# Patient Record
Sex: Female | Born: 1975
Health system: Southern US, Community
[De-identification: ages and names within clinical notes are randomized; demographics above are authoritative.]

## PROBLEM LIST (undated history)

## (undated) DIAGNOSIS — R519 Headache, unspecified: Secondary | ICD-10-CM

## (undated) DIAGNOSIS — E039 Hypothyroidism, unspecified: Secondary | ICD-10-CM

## (undated) DIAGNOSIS — O1490 Unspecified pre-eclampsia, unspecified trimester: Secondary | ICD-10-CM

## (undated) DIAGNOSIS — C801 Malignant (primary) neoplasm, unspecified: Secondary | ICD-10-CM

## (undated) DIAGNOSIS — F32A Depression, unspecified: Secondary | ICD-10-CM

## (undated) DIAGNOSIS — O1420 HELLP syndrome (HELLP), unspecified trimester: Secondary | ICD-10-CM

## (undated) DIAGNOSIS — R51 Headache: Secondary | ICD-10-CM

## (undated) DIAGNOSIS — F419 Anxiety disorder, unspecified: Secondary | ICD-10-CM

## (undated) HISTORY — DX: Anxiety disorder, unspecified: F41.9

## (undated) HISTORY — PX: COSMETIC SURGERY: SHX468

## (undated) HISTORY — DX: Malignant (primary) neoplasm, unspecified: C80.1

## (undated) HISTORY — PX: BREAST SURGERY: SHX581

## (undated) HISTORY — DX: Depression, unspecified: F32.A

---

## 2003-04-17 HISTORY — PX: TONSILLECTOMY: SUR1361

## 2013-04-10 ENCOUNTER — Emergency Department: Payer: Self-pay | Admitting: Emergency Medicine

## 2013-04-10 LAB — COMPREHENSIVE METABOLIC PANEL
Albumin: 4.5 g/dL (ref 3.4–5.0)
Anion Gap: 6 — ABNORMAL LOW (ref 7–16)
BUN: 11 mg/dL (ref 7–18)
Calcium, Total: 9.2 mg/dL (ref 8.5–10.1)
Chloride: 107 mmol/L (ref 98–107)
Co2: 24 mmol/L (ref 21–32)
EGFR (African American): >60
EGFR (Non-African Amer.): >60
Osmolality: 273 (ref 275–301)
Potassium: 3.8 mmol/L (ref 3.5–5.1)
SGPT (ALT): 21 U/L (ref 12–78)
Total Protein: 8 g/dL (ref 6.4–8.2)

## 2013-04-10 LAB — URINALYSIS, COMPLETE
Glucose,UR: NEGATIVE mg/dL (ref 0–75)
Nitrite: NEGATIVE
Ph: 6 (ref 4.5–8.0)
Protein: 30
RBC,UR: 311 /HPF (ref 0–5)
Specific Gravity: 1.006 (ref 1.003–1.030)
Squamous Epithelial: 1

## 2013-04-10 LAB — CBC
MCH: 31 pg (ref 26.0–34.0)
MCHC: 33.5 g/dL (ref 32.0–36.0)
MCV: 93 fL (ref 80–100)
RDW: 12 % (ref 11.5–14.5)

## 2013-04-10 LAB — WET PREP, GENITAL

## 2013-04-10 LAB — GC/CHLAMYDIA PROBE AMP

## 2013-07-28 ENCOUNTER — Other Ambulatory Visit: Payer: Self-pay

## 2013-09-21 ENCOUNTER — Encounter: Payer: Self-pay | Admitting: Maternal and Fetal Medicine

## 2013-10-22 ENCOUNTER — Encounter: Payer: Self-pay | Admitting: Obstetrics and Gynecology

## 2013-11-24 ENCOUNTER — Encounter: Payer: Self-pay | Admitting: Pediatric Cardiology

## 2013-11-24 DIAGNOSIS — Z8279 Family history of other congenital malformations, deformations and chromosomal abnormalities: Secondary | ICD-10-CM | POA: Insufficient documentation

## 2014-01-03 ENCOUNTER — Observation Stay: Payer: Self-pay

## 2014-01-03 LAB — URINALYSIS, COMPLETE
BILIRUBIN, UR: NEGATIVE
Glucose,UR: NEGATIVE mg/dL (ref 0–75)
Ketone: NEGATIVE
LEUKOCYTE ESTERASE: NEGATIVE
NITRITE: NEGATIVE
PH: 6 (ref 4.5–8.0)
PROTEIN: NEGATIVE
RBC,UR: 73 /HPF (ref 0–5)
Specific Gravity: 1.005 (ref 1.003–1.030)
WBC UR: 18 /HPF (ref 0–5)

## 2014-01-05 LAB — URINE CULTURE

## 2014-01-25 ENCOUNTER — Observation Stay: Payer: Self-pay | Admitting: Obstetrics and Gynecology

## 2014-01-27 ENCOUNTER — Observation Stay: Payer: Self-pay | Admitting: Obstetrics and Gynecology

## 2014-01-27 ENCOUNTER — Ambulatory Visit: Payer: Self-pay | Admitting: Obstetrics and Gynecology

## 2014-01-27 LAB — CBC WITH DIFFERENTIAL/PLATELET
BASOS PCT: 0.3 %
Basophil #: 0 10*3/uL (ref 0.0–0.1)
EOS PCT: 0.5 %
Eosinophil #: 0.1 10*3/uL (ref 0.0–0.7)
HCT: 38.1 % (ref 35.0–47.0)
HGB: 12.6 g/dL (ref 12.0–16.0)
LYMPHS ABS: 1.8 10*3/uL (ref 1.0–3.6)
LYMPHS PCT: 12.9 %
MCH: 31.1 pg (ref 26.0–34.0)
MCHC: 33.1 g/dL (ref 32.0–36.0)
MCV: 94 fL (ref 80–100)
Monocyte #: 1.1 x10 3/mm — ABNORMAL HIGH (ref 0.2–0.9)
Monocyte %: 7.7 %
Neutrophil #: 10.9 10*3/uL — ABNORMAL HIGH (ref 1.4–6.5)
Neutrophil %: 78.6 %
Platelet: 112 10*3/uL — ABNORMAL LOW (ref 150–440)
RBC: 4.06 10*6/uL (ref 3.80–5.20)
RDW: 13.7 % (ref 11.5–14.5)
WBC: 13.9 10*3/uL — AB (ref 3.6–11.0)

## 2014-01-27 LAB — COMPREHENSIVE METABOLIC PANEL
ALT: 75 U/L — AB
AST: 83 U/L — AB (ref 15–37)
Albumin: 2.7 g/dL — ABNORMAL LOW (ref 3.4–5.0)
Alkaline Phosphatase: 113 U/L
Anion Gap: 10 (ref 7–16)
BILIRUBIN TOTAL: 0.5 mg/dL (ref 0.2–1.0)
BUN: 9 mg/dL (ref 7–18)
CHLORIDE: 106 mmol/L (ref 98–107)
Calcium, Total: 8.4 mg/dL — ABNORMAL LOW (ref 8.5–10.1)
Co2: 24 mmol/L (ref 21–32)
Creatinine: 0.74 mg/dL (ref 0.60–1.30)
EGFR (African American): >60
EGFR (Non-African Amer.): >60
Glucose: 84 mg/dL (ref 65–99)
Osmolality: 277 (ref 275–301)
POTASSIUM: 3.7 mmol/L (ref 3.5–5.1)
SODIUM: 140 mmol/L (ref 136–145)
Total Protein: 6.2 g/dL — ABNORMAL LOW (ref 6.4–8.2)

## 2014-01-27 LAB — AMYLASE: AMYLASE: 38 U/L (ref 25–115)

## 2014-01-27 LAB — LIPASE, BLOOD: Lipase: 91 U/L (ref 73–393)

## 2014-02-05 DIAGNOSIS — O1420 HELLP syndrome (HELLP), unspecified trimester: Secondary | ICD-10-CM

## 2014-02-05 DIAGNOSIS — O1490 Unspecified pre-eclampsia, unspecified trimester: Secondary | ICD-10-CM

## 2014-04-29 LAB — HM PAP SMEAR: HM PAP: NEGATIVE

## 2014-08-24 NOTE — H&P (Signed)
L&D Evaluation:  History:  Presents with abdominal pain   Patient's Medical History No Chronic Illness   Patient's Surgical History T&A   Medications Pre Natal Vitamins   Allergies NKDA   Social History none   Family History Non-Contributory   ROS:  ROS All systems were reviewed.  HEENT, CNS, GI, GU, Respiratory, CV, Renal and Musculoskeletal systems were found to be normal.   Exam:  Vital Signs stable   General writhing in bad, states she can't get comfortable   Mental Status clear  anxious   Chest clear   Heart normal sinus rhythm   Abdomen gravid, non-tender   Estimated Fetal Weight Average for gestational age   Back no CVAT   Edema no edema   Pelvic cervix closed and thick   FHT normal rate with no decels   Fetal Heart Rate 160   Ucx absent   Skin dry   Impression:  Impression pelvic pain at [redacted]w[redacted]d   Plan:  Plan UA, EFM/NST, fluids, tylenol PO   Electronic Signatures: Trudee Kuster, Quinnetta Roepke N (CNM)  (Signed 20-Sep-15 16:57)  Authored: L&D Evaluation   Last Updated: 20-Sep-15 16:57 by Evonnie Pat (CNM)

## 2014-11-01 ENCOUNTER — Telehealth: Payer: Self-pay | Admitting: Obstetrics and Gynecology

## 2014-11-01 NOTE — Telephone Encounter (Signed)
Pls advise.  

## 2014-11-01 NOTE — Telephone Encounter (Signed)
Pt let refill run out of xanax, she needs refill, or ativan.

## 2014-11-02 ENCOUNTER — Other Ambulatory Visit: Payer: Self-pay | Admitting: Obstetrics and Gynecology

## 2014-11-02 MED ORDER — ALPRAZOLAM 0.5 MG PO TABS
0.5000 mg | ORAL_TABLET | Freq: Every evening | ORAL | Status: DC | PRN
Start: 2014-11-02 — End: 2015-03-31

## 2014-11-02 NOTE — Telephone Encounter (Signed)
Please let her know I sent in rx for xanax

## 2014-11-02 NOTE — Telephone Encounter (Signed)
Left detailed message - rx sent to pharmacy  

## 2015-02-10 ENCOUNTER — Ambulatory Visit: Payer: Self-pay | Admitting: Obstetrics and Gynecology

## 2015-02-16 ENCOUNTER — Encounter: Payer: Self-pay | Admitting: *Deleted

## 2015-02-16 ENCOUNTER — Ambulatory Visit
Admission: EM | Admit: 2015-02-16 | Discharge: 2015-02-16 | Disposition: A | Discharge status: Home / Self Care | Payer: Commercial Managed Care - HMO | Attending: Family Medicine | Admitting: Family Medicine

## 2015-02-16 DIAGNOSIS — A084 Viral intestinal infection, unspecified: Secondary | ICD-10-CM | POA: Diagnosis not present

## 2015-02-16 LAB — CBC WITH DIFFERENTIAL/PLATELET
BASOS PCT: 0 %
Basophils Absolute: 0 10*3/uL (ref 0–0.1)
EOS ABS: 0 10*3/uL (ref 0–0.7)
EOS PCT: 0 %
HCT: 41.2 % (ref 35.0–47.0)
HEMOGLOBIN: 14 g/dL (ref 12.0–16.0)
Lymphocytes Relative: 3 %
Lymphs Abs: 0.2 10*3/uL — ABNORMAL LOW (ref 1.0–3.6)
MCH: 31.1 pg (ref 26.0–34.0)
MCHC: 34 g/dL (ref 32.0–36.0)
MCV: 91.6 fL (ref 80.0–100.0)
MONOS PCT: 4 %
Monocytes Absolute: 0.2 10*3/uL (ref 0.2–0.9)
NEUTROS PCT: 93 %
Neutro Abs: 5 10*3/uL (ref 1.4–6.5)
PLATELETS: 207 10*3/uL (ref 150–440)
RBC: 4.5 MIL/uL (ref 3.80–5.20)
RDW: 12.6 % (ref 11.5–14.5)
WBC: 5.5 10*3/uL (ref 3.6–11.0)

## 2015-02-16 LAB — COMPREHENSIVE METABOLIC PANEL
ALBUMIN: 4.5 g/dL (ref 3.5–5.0)
ALK PHOS: 50 U/L (ref 38–126)
ALT: 11 U/L — ABNORMAL LOW (ref 14–54)
ANION GAP: 11 (ref 5–15)
AST: 36 U/L (ref 15–41)
BUN: 23 mg/dL — ABNORMAL HIGH (ref 6–20)
CALCIUM: 9 mg/dL (ref 8.9–10.3)
CHLORIDE: 102 mmol/L (ref 101–111)
CO2: 23 mmol/L (ref 22–32)
Creatinine, Ser: 0.86 mg/dL (ref 0.44–1.00)
GFR calc non Af Amer: >60 mL/min (ref >60)
GLUCOSE: 143 mg/dL — AB (ref 65–99)
Potassium: 4.1 mmol/L (ref 3.5–5.1)
SODIUM: 136 mmol/L (ref 135–145)
Total Bilirubin: 0.9 mg/dL (ref 0.3–1.2)
Total Protein: 7.2 g/dL (ref 6.5–8.1)

## 2015-02-16 LAB — URINALYSIS COMPLETE WITH MICROSCOPIC (ARMC ONLY)
GLUCOSE, UA: NEGATIVE mg/dL
HGB URINE DIPSTICK: NEGATIVE
Ketones, ur: NEGATIVE mg/dL
LEUKOCYTES UA: NEGATIVE
NITRITE: NEGATIVE
PH: 6 (ref 5.0–8.0)
PROTEIN: 30 mg/dL — AB
SPECIFIC GRAVITY, URINE: 1.025 (ref 1.005–1.030)

## 2015-02-16 LAB — RAPID INFLUENZA A&B ANTIGENS (ARMC ONLY): INFLUENZA A (ARMC): NOT DETECTED

## 2015-02-16 LAB — RAPID INFLUENZA A&B ANTIGENS: Influenza B (ARMC): NOT DETECTED

## 2015-02-16 MED ORDER — ONDANSETRON 8 MG PO TBDP: 8.0000 mg | ORAL_TABLET | Freq: Two times a day (BID) | ORAL | Status: DC

## 2015-02-16 MED ORDER — ACETAMINOPHEN 500 MG PO TABS
1000.0000 mg | ORAL_TABLET | Freq: Once | ORAL | Status: AC
  Administered 2015-02-16: 1000 mg via ORAL

## 2015-02-16 MED ORDER — ACETAMINOPHEN 500 MG PO TABS: 500.0000 mg | ORAL_TABLET | Freq: Once | ORAL | Status: DC

## 2015-02-16 NOTE — ED Provider Notes (Signed)
CSN: [id number]     Arrival date & time 02/16/15  1826 History   First MD Initiated Contact with Patient 02/16/15 2011     Chief Complaint  Patient presents with  . Influenza   (Consider location/radiation/quality/duration/timing/severity/associated sxs/prior Treatment) HPI Comments: 39 yo female with a c/o fevers, bodyaches, chills, nausea, vomiting and diarrhea that started at 4:30 am this morning. States has had about 5 episodes of vomiting and diarrhea today. Denies any hematemesis, hematochezia, or melena. Has also had intermittent abdominal cramping. Denies any nasal congestion, cough, sore throat. Has had a mild runny nose. Has been able to drink a small amount of gatorade today.   Patient is a 39 y.o. female presenting with flu symptoms. The history is provided by the patient.  Influenza   Past Medical History  Diagnosis Date  . Anxiety   . HELLP syndrome, complicating childbirth    Past Surgical History  Procedure Laterality Date  . Cesarean section  02/05/14    HELLP syndrome  . Tonsillectomy  2005   Family History  Problem Relation Age of Onset  . Heart disease Father    Social History  Substance Use Topics  . Smoking status: Former Research scientist (life sciences)  . Smokeless tobacco: Never Used  . Alcohol Use: No   OB History    Gravida Para Term Preterm AB TAB SAB Ectopic Multiple Living   2    1  1   1      Review of Systems  Allergies  Review of patient's allergies indicates no known allergies.  Home Medications   Prior to Admission medications   Medication Sig Start Date End Date Taking? Authorizing Provider  ALPRAZolam Duanne Moron) 0.5 MG tablet Take 1 tablet (0.5 mg total) by mouth at bedtime as needed for anxiety. 11/02/14  Yes Melody Valene Bors, CNM  ondansetron (ZOFRAN ODT) 8 MG disintegrating tablet Take 1 tablet (8 mg total) by mouth 2 (two) times daily. 02/16/15   Norval Gable, MD  ondansetron (ZOFRAN ODT) 8 MG disintegrating tablet Take 1 tablet (8 mg total) by mouth 2 (two)  times daily. 02/16/15   Norval Gable, MD   Meds Ordered and Administered this Visit   Medications  acetaminophen (TYLENOL) tablet 1,000 mg (1,000 mg Oral Given 02/16/15 2116)    BP 102/63 mmHg  Pulse 87  Temp(Src) 100.3 F (37.9 C) (Tympanic)  Resp 16  Ht 5\' 5"  (1.651 m)  SpO2 98%  LMP 02/09/2015 No data found.   Physical Exam  Constitutional: She appears well-developed and well-nourished. No distress.  HENT:  Head: Normocephalic.  Right Ear: Tympanic membrane, external ear and ear canal normal.  Left Ear: Tympanic membrane, external ear and ear canal normal.  Nose: Nose normal.  Mouth/Throat: Oropharynx is clear and moist and mucous membranes are normal.  Eyes: Conjunctivae and EOM are normal. Pupils are equal, round, and reactive to light. Right eye exhibits no discharge. Left eye exhibits no discharge. No scleral icterus.  Neck: Normal range of motion. Neck supple. No JVD present. No tracheal deviation present. No thyromegaly present.  Cardiovascular: Normal rate, regular rhythm, normal heart sounds and intact distal pulses.   No murmur heard. Pulmonary/Chest: Effort normal and breath sounds normal. No stridor. No respiratory distress. She has no wheezes. She has no rales. She exhibits no tenderness.  Abdominal: Soft. Bowel sounds are normal. She exhibits no distension and no mass. There is tenderness (mild diffuse tenderness to palpation; no rebound or guarding). There is no rebound and no guarding.  Lymphadenopathy:    She has no cervical adenopathy.  Neurological: She is alert.  Skin: She is not diaphoretic.  Vitals reviewed.   ED Course  Procedures (including critical care time)  Labs Review Labs Reviewed  CBC WITH DIFFERENTIAL/PLATELET - Abnormal; Notable for the following:    Lymphs Abs 0.2 (*)    All other components within normal limits  COMPREHENSIVE METABOLIC PANEL - Abnormal; Notable for the following:    Glucose, Bld 143 (*)    BUN 23 (*)    ALT 11 (*)     All other components within normal limits  URINALYSIS COMPLETEWITH MICROSCOPIC ([facility] ONLY) - Abnormal; Notable for the following:    APPearance HAZY (*)    Bilirubin Urine 1+ (*)    Protein, ur 30 (*)    Bacteria, UA MANY (*)    Squamous Epithelial / LPF TOO NUMEROUS TO COUNT (*)    All other components within normal limits  RAPID INFLUENZA A&B ANTIGENS ([facility] ONLY)  URINE CULTURE    Imaging Review No results found.   Visual Acuity Review  Right Eye Distance:   Left Eye Distance:   Bilateral Distance:    Right Eye Near:   Left Eye Near:    Bilateral Near:         MDM   1. Viral gastroenteritis    Discharge Medication List as of 02/16/2015  9:11 PM    START taking these medications   Details  !! ondansetron (ZOFRAN ODT) 8 MG disintegrating tablet Take 1 tablet (8 mg total) by mouth 2 (two) times daily., Starting 02/16/2015, Until Discontinued, Normal    !! ondansetron (ZOFRAN ODT) 8 MG disintegrating tablet Take 1 tablet (8 mg total) by mouth 2 (two) times daily., Starting 02/16/2015, Until Discontinued, Print     !! - Potential duplicate medications found. Please discuss with provider.     1. Labs results and diagnosis reviewed with patient  2. Patient given zofran 8mg  po once with improvement of symptoms; patient tolerating po liquids; blood pressure recheck: 103/63 3. rx as per orders above; reviewed possible side effects, interactions, risks and benefits   4. Recommend supportive treatment with increased fluids, clear liquid diet and advance as tolerated 5. Follow-up prn if symptoms worsen or don't improve     Norval Gable, MD 02/16/15 2153

## 2015-02-16 NOTE — ED Notes (Signed)
Patient complains of fever, bodyaches, chills, nausea, diarrhea. She states that this morning around 430am and states that she vomited and last episode was around 1030. She states that pain has progressively got worse. She states that she feels like she has the flu and that her hair hurts.

## 2015-02-18 LAB — URINE CULTURE: SPECIAL REQUESTS: NORMAL

## 2015-02-18 NOTE — ED Notes (Signed)
Final report of urine C&S consistent w contaminated collection

## 2015-02-21 ENCOUNTER — Telehealth: Payer: Self-pay

## 2015-02-21 NOTE — ED Notes (Signed)
Patient called for results of Urine Culture. Lab suggested a "recollect". States not improving but "the doctor I work for prescribed Bactrim and I have taken 4 doses". Instructed that Lab recommended a recollection of Urine for culture. Instructed patient to return to [facility] or follow up with own doctor

## 2015-02-24 ENCOUNTER — Ambulatory Visit (INDEPENDENT_AMBULATORY_CARE_PROVIDER_SITE_OTHER): Payer: Commercial Managed Care - HMO | Admitting: Obstetrics and Gynecology

## 2015-02-24 ENCOUNTER — Encounter: Payer: Self-pay | Admitting: Obstetrics and Gynecology

## 2015-02-24 VITALS — BP 97/54 | HR 76 | Ht 65.0 in | Wt 160.7 lb

## 2015-02-24 DIAGNOSIS — E663 Overweight: Secondary | ICD-10-CM

## 2015-02-24 DIAGNOSIS — R350 Frequency of micturition: Secondary | ICD-10-CM | POA: Diagnosis not present

## 2015-02-24 DIAGNOSIS — Z01419 Encounter for gynecological examination (general) (routine) without abnormal findings: Secondary | ICD-10-CM

## 2015-02-24 LAB — POCT URINALYSIS DIPSTICK
Bilirubin, UA: NEGATIVE
Blood, UA: NEGATIVE
GLUCOSE UA: NEGATIVE
Ketones, UA: NEGATIVE
Leukocytes, UA: NEGATIVE
NITRITE UA: NEGATIVE
PROTEIN UA: NEGATIVE
SPEC GRAV UA: 1.015
UROBILINOGEN UA: 0.2
pH, UA: 6

## 2015-02-24 NOTE — Patient Instructions (Signed)
  Place annual gynecologic exam patient instructions here.  Thank you for enrolling in [place]. Please follow the instructions below to securely access your online medical record. MyChart allows you to send messages to your doctor, view your test results, manage appointments, and more.   How Do I Sign Up? 1. In your Internet browser, go to [employer] and enter [email or web address]. 2. Click on the Sign Up Now link in the Sign In box. You will see the New Member Sign Up page. 3. Enter your MyChart Access Code exactly as it appears below. You will not need to use this code after you've completed the sign-up process. If you do not sign up before the expiration date, you must request a new code.  MyChart Access Code: [id number] Expires: 03/25/2015 10:40 AM  4. Enter your Social Security Number ([id number]) and Date of Birth (mm/dd/yyyy) as indicated and click Submit. You will be taken to the next sign-up page. 5. Create a MyChart ID. This will be your MyChart login ID and cannot be changed, so think of one that is secure and easy to remember. 6. Create a MyChart password. You can change your password at any time. 7. Enter your Password Reset Question and Answer. This can be used at a later time if you forget your password.  8. Enter your e-mail address. You will receive e-mail notification when new information is available in [place]. 9. Click Sign Up. You can now view your medical record.   Additional Information Remember, MyChart is NOT to be used for urgent needs. For medical emergencies, dial 911.

## 2015-02-24 NOTE — Progress Notes (Signed)
  Subjective:     Leah Thompson is a 39 y.o. female and is here for a comprehensive physical exam. The patient reports difficulty losing weight, with regular exercise. Is interested in trying weight loss meds. Also does not feel need to take anxiety meds anymore..  Social History   Social History  . Marital Status: Married    Spouse Name: N/A  . Number of Children: N/A  . Years of Education: N/A   Occupational History  . Not on file.   Social History Main Topics  . Smoking status: Former Research scientist (life sciences)  . Smokeless tobacco: Never Used  . Alcohol Use: No  . Drug Use: No  . Sexual Activity: Yes    Birth Control/ Protection: Condom   Other Topics Concern  . Not on file   Social History Narrative   Health Maintenance  Topic Date Due  . HIV Screening  08/10/1992  . TETANUS/TDAP  08/10/1996  . INFLUENZA VACCINE  11/15/2014  . PAP SMEAR  04/29/2017    The following portions of the patient's history were reviewed and updated as appropriate: allergies, current medications, past family history, past medical history, past social history, past surgical history and problem list.  Review of Systems A comprehensive review of systems was negative.   Objective:    General appearance: alert, cooperative and appears stated age Neck: no adenopathy, no carotid bruit, no JVD, supple, symmetrical, trachea midline and thyroid not enlarged, symmetric, no tenderness/mass/nodules Lungs: clear to auscultation bilaterally Breasts: normal appearance, no masses or tenderness Heart: regular rate and rhythm, S1, S2 normal, no murmur, click, rub or gallop Abdomen: soft, non-tender; bowel sounds normal; no masses,  no organomegaly Pelvic: cervix normal in appearance, external genitalia normal, no adnexal masses or tenderness, no cervical motion tenderness, rectovaginal septum normal, uterus normal size, shape, and consistency and vagina normal without discharge   Bilateral breast implants noted without  defect Assessment:    Healthy female exam. overweight      Plan:  Pap and labs not indicated. RTC next week to start weight loss medication RTC 1 year for annual exam.   See After Visit Summary for Counseling Recommendations

## 2015-03-08 ENCOUNTER — Telehealth: Payer: Self-pay | Admitting: Obstetrics and Gynecology

## 2015-03-08 ENCOUNTER — Other Ambulatory Visit: Payer: Self-pay | Admitting: Obstetrics and Gynecology

## 2015-03-08 MED ORDER — LORAZEPAM 1 MG PO TABS: 1.0000 mg | ORAL_TABLET | Freq: Three times a day (TID) | ORAL | Status: DC

## 2015-03-08 NOTE — Telephone Encounter (Signed)
Please let her know it is ready to pick up

## 2015-03-08 NOTE — Telephone Encounter (Signed)
At last appointment patient did not think she needed xanax or ativan. However she is going on a flight on Thursday and thinks she would need something and prefers ativan over xanax. Thanks

## 2015-03-31 ENCOUNTER — Encounter: Payer: Self-pay | Admitting: Obstetrics and Gynecology

## 2015-03-31 ENCOUNTER — Ambulatory Visit (INDEPENDENT_AMBULATORY_CARE_PROVIDER_SITE_OTHER): Payer: Commercial Managed Care - HMO | Admitting: Obstetrics and Gynecology

## 2015-03-31 VITALS — BP 111/80 | HR 89 | Wt 149.6 lb

## 2015-03-31 DIAGNOSIS — E669 Obesity, unspecified: Secondary | ICD-10-CM | POA: Diagnosis not present

## 2015-03-31 MED ORDER — CYANOCOBALAMIN 1000 MCG/ML IJ SOLN
1000.0000 ug | Freq: Once | INTRAMUSCULAR | Status: AC
  Administered 2015-03-31: 1000 ug via INTRAMUSCULAR

## 2015-03-31 NOTE — Progress Notes (Cosign Needed)
Patient ID: Leah Thompson, female   DOB: 1975-07-08, 39 y.o.   MRN: [id number] Pt is here for wt, bp check, b-12 inj She is doing very well on her phentermine, denies any s/e  02/26/15- wt 160lb 03/31/15- wt-149.6

## 2015-04-15 ENCOUNTER — Telehealth: Payer: Self-pay | Admitting: Obstetrics and Gynecology

## 2015-04-15 ENCOUNTER — Other Ambulatory Visit: Payer: Self-pay | Admitting: *Deleted

## 2015-04-15 ENCOUNTER — Other Ambulatory Visit: Payer: Self-pay | Admitting: Obstetrics and Gynecology

## 2015-04-15 MED ORDER — LORAZEPAM 1 MG PO TABS: 1.0000 mg | ORAL_TABLET | Freq: Three times a day (TID) | ORAL | Status: DC

## 2015-04-15 MED ORDER — CYANOCOBALAMIN 1000 MCG/ML IJ SOLN: 1000.0000 ug | Freq: Once | INTRAMUSCULAR | Status: DC

## 2015-04-15 NOTE — Telephone Encounter (Signed)
Pt called and she needs a RX called in for B12, she has appt on 04/27/14 for her weight BP and B12.

## 2015-04-15 NOTE — Telephone Encounter (Signed)
Done-ac 

## 2015-04-15 NOTE — Telephone Encounter (Signed)
Pt called and wanted to see if you could write a RX for her adavan.

## 2015-04-15 NOTE — Telephone Encounter (Signed)
Please let her know it was done

## 2015-04-28 ENCOUNTER — Ambulatory Visit (INDEPENDENT_AMBULATORY_CARE_PROVIDER_SITE_OTHER): Payer: Commercial Managed Care - HMO | Admitting: Obstetrics and Gynecology

## 2015-04-28 ENCOUNTER — Ambulatory Visit: Payer: Commercial Managed Care - HMO

## 2015-04-28 VITALS — BP 104/72 | HR 97 | Wt 146.0 lb

## 2015-04-28 DIAGNOSIS — E669 Obesity, unspecified: Secondary | ICD-10-CM

## 2015-04-28 MED ORDER — CYANOCOBALAMIN 1000 MCG/ML IJ SOLN
1000.0000 ug | Freq: Once | INTRAMUSCULAR | Status: AC
  Administered 2015-04-28: 1000 ug via INTRAMUSCULAR

## 2015-04-28 NOTE — Progress Notes (Cosign Needed)
Pt is here for wt, bp check, b-12 inj Denies any s/e, is very happy with her weight loss  03/31/15 wt-149lb

## 2015-06-13 ENCOUNTER — Telehealth: Payer: Self-pay | Admitting: Obstetrics and Gynecology

## 2015-06-13 NOTE — Telephone Encounter (Signed)
Called pt and explained to her that she will need appt with [facility]

## 2015-06-13 NOTE — Telephone Encounter (Signed)
PT CALLED AND SHE WANTED A REFILL SENT IN ON HER B12 AND HER GENERIC PHENTERMINE, PT IS OUT OF IT AS OF YESTERDAY, I THOUGHT PT NEEDED TO BE OFF FOR 2 WEEKS AND SEE [facility], BUT PT DID NOT SCHEDULE AN APPT WHEN SHE LEFT LAST MONTH, SHE CALLED TODAY AND SCHEDULED NURSE VISIT FOR Thursday, SO LET ME KNOW IF WE NEED TO CX AND SCHEDULE WITH [facility].

## 2015-06-16 ENCOUNTER — Ambulatory Visit (INDEPENDENT_AMBULATORY_CARE_PROVIDER_SITE_OTHER): Payer: Commercial Managed Care - HMO | Admitting: Obstetrics and Gynecology

## 2015-06-16 VITALS — BP 104/71 | HR 91 | Wt 141.5 lb

## 2015-06-16 DIAGNOSIS — Z79899 Other long term (current) drug therapy: Secondary | ICD-10-CM | POA: Diagnosis not present

## 2015-06-16 MED ORDER — CYANOCOBALAMIN 1000 MCG/ML IJ SOLN
1000.0000 ug | Freq: Once | INTRAMUSCULAR | Status: AC
  Administered 2015-06-16: 1000 ug via INTRAMUSCULAR

## 2015-06-16 NOTE — Progress Notes (Signed)
SUBJECTIVE:  40 y.o. here for follow-up weight loss visit, previously seen 4 weeks ago. Denies any concerns and feels like medication worked well and has lost 20 # . Is happy with current weight and does not need to continue on medication.  OBJECTIVE:  BP 104/71 mmHg  Pulse 91  Wt 141 lb 8 oz (64.184 kg)  Body mass index is 23.55 kg/(m^2). Patient appears well. ASSESSMENT:  Obesity- resolved PLAN:  To continue with regular exercise and good eating habits. RTC as needed.  Melody [place], CNM

## 2015-07-11 ENCOUNTER — Telehealth: Payer: Self-pay | Admitting: Obstetrics and Gynecology

## 2015-07-11 NOTE — Telephone Encounter (Signed)
Pt needs appt for ativan

## 2015-07-11 NOTE — Telephone Encounter (Signed)
pls advise

## 2015-07-12 ENCOUNTER — Other Ambulatory Visit: Payer: Self-pay | Admitting: Obstetrics and Gynecology

## 2015-07-12 MED ORDER — LORAZEPAM 1 MG PO TABS: 1.0000 mg | ORAL_TABLET | Freq: Three times a day (TID) | ORAL | Status: DC

## 2015-07-12 NOTE — Telephone Encounter (Signed)
rx faxed to pharmacy

## 2015-07-12 NOTE — Telephone Encounter (Signed)
No need, she is up to date with exam- RX refilled.

## 2015-08-16 ENCOUNTER — Emergency Department (HOSPITAL_COMMUNITY)
Admission: EM | Admit: 2015-08-16 | Discharge: 2015-08-16 | Disposition: A | Discharge status: Home / Self Care | Payer: Commercial Managed Care - HMO | Attending: Emergency Medicine | Admitting: Emergency Medicine

## 2015-08-16 ENCOUNTER — Emergency Department (HOSPITAL_COMMUNITY): Payer: Commercial Managed Care - HMO

## 2015-08-16 ENCOUNTER — Encounter (HOSPITAL_COMMUNITY): Payer: Self-pay | Admitting: Emergency Medicine

## 2015-08-16 DIAGNOSIS — Z87891 Personal history of nicotine dependence: Secondary | ICD-10-CM | POA: Diagnosis not present

## 2015-08-16 DIAGNOSIS — E041 Nontoxic single thyroid nodule: Secondary | ICD-10-CM | POA: Diagnosis not present

## 2015-08-16 DIAGNOSIS — R221 Localized swelling, mass and lump, neck: Secondary | ICD-10-CM | POA: Diagnosis not present

## 2015-08-16 DIAGNOSIS — F419 Anxiety disorder, unspecified: Secondary | ICD-10-CM | POA: Diagnosis not present

## 2015-08-16 DIAGNOSIS — R1310 Dysphagia, unspecified: Secondary | ICD-10-CM | POA: Diagnosis present

## 2015-08-16 LAB — BASIC METABOLIC PANEL
ANION GAP: 11 (ref 5–15)
BUN: 9 mg/dL (ref 6–20)
CALCIUM: 9.7 mg/dL (ref 8.9–10.3)
CO2: 21 mmol/L — AB (ref 22–32)
Chloride: 109 mmol/L (ref 101–111)
Creatinine, Ser: 0.64 mg/dL (ref 0.44–1.00)
GFR calc Af Amer: >60 mL/min (ref >60)
GFR calc non Af Amer: >60 mL/min (ref >60)
GLUCOSE: 82 mg/dL (ref 65–99)
POTASSIUM: 3.8 mmol/L (ref 3.5–5.1)
Sodium: 141 mmol/L (ref 135–145)

## 2015-08-16 LAB — CBC
HEMATOCRIT: 45.9 % (ref 36.0–46.0)
Hemoglobin: 15.2 g/dL — ABNORMAL HIGH (ref 12.0–15.0)
MCH: 31.8 pg (ref 26.0–34.0)
MCHC: 33.1 g/dL (ref 30.0–36.0)
MCV: 96 fL (ref 78.0–100.0)
Platelets: 238 10*3/uL (ref 150–400)
RBC: 4.78 MIL/uL (ref 3.87–5.11)
RDW: 12.5 % (ref 11.5–15.5)
WBC: 7 10*3/uL (ref 4.0–10.5)

## 2015-08-16 LAB — T4, FREE: Free T4: 0.67 ng/dL (ref 0.61–1.12)

## 2015-08-16 LAB — TSH: TSH: 1.117 u[IU]/mL (ref 0.350–4.500)

## 2015-08-16 NOTE — ED Provider Notes (Signed)
CSN: [id number]     Arrival date & time 08/16/15  1128 History  By signing my name below, I, Eustaquio Maize, attest that this documentation has been prepared under the direction and in the presence of Wendie Simmer, PA-C. Electronically Signed: Eustaquio Maize, ED Scribe. 08/16/2015. 1:41 PM.   Chief Complaint  Patient presents with  . Dysphagia   The history is provided by the patient. No language interpreter was used.     HPI Comments: Leah Thompson is a 40 y.o. female who presents to the Emergency Department complaining of a gradual onset, constant, lump in throat that she noticed last night. Pt states that she has woken up for the past several months with intermittent sore throats. She also states that for the past few nights she has been having hot flashes. Pt was seen at her PCP's office this morning and told to come to the ED for further work up of possible thyroid issues with blood work and an MRI. Pt denies any increase in hair loss. She does mention losing 20-30 pounds in the past 6 months but was on a diet plan and taking Pheniramine. She also denies fever, chills, or any other associated symptoms. Pt is an occasional smoker. Pt had her thyroid levels checked a couple of years ago with no acute findings.     Past Medical History  Diagnosis Date  . Anxiety   . HELLP syndrome, complicating childbirth   . HELLP syndrome    Past Surgical History  Procedure Laterality Date  . Cesarean section  02/05/14    HELLP syndrome  . Tonsillectomy  2005   Family History  Problem Relation Age of Onset  . Heart disease Father    Social History  Substance Use Topics  . Smoking status: Former Research scientist (life sciences)  . Smokeless tobacco: Never Used  . Alcohol Use: No   OB History    Gravida Para Term Preterm AB TAB SAB Ectopic Multiple Living   2    1  1   1      Review of Systems A complete 10 system review of systems was obtained and all systems are negative except as noted in the HPI and PMH.    Allergies  Review of patient's allergies indicates no known allergies.  Home Medications   Prior to Admission medications   Medication Sig Start Date End Date Taking? Authorizing Provider  LORazepam (ATIVAN) 1 MG tablet Take 1 tablet (1 mg total) by mouth every 8 (eight) hours. 07/12/15   Melody N Shambley, CNM  sulfamethoxazole-trimethoprim (BACTRIM DS,SEPTRA DS) 800-160 MG tablet Take 1 tablet by mouth 2 (two) times daily. Reported on 04/28/2015    Historical Provider, MD   BP 129/68 mmHg  Pulse 64  Temp(Src) 98.7 F (37.1 C) (Oral)  Resp 18  SpO2 100%  LMP 07/25/2015 (Exact Date)   Physical Exam  Constitutional: She is oriented to person, place, and time. She appears well-developed and well-nourished. No distress.  HENT:  Head: Normocephalic and atraumatic.  Eyes: Conjunctivae and EOM are normal.  Neck: Neck supple.  3 x 4 cm mass on the right side of throat.   Cardiovascular: Normal rate.   Pulmonary/Chest: Effort normal. No respiratory distress.  Musculoskeletal: Normal range of motion.  Neurological: She is alert and oriented to person, place, and time.  Skin: Skin is warm and dry.  Psychiatric: She has a normal mood and affect. Her behavior is normal.  Nursing note and vitals reviewed.   ED Course  Procedures  DIAGNOSTIC STUDIES: Oxygen Saturation is 100% on RA, normal by my interpretation.    COORDINATION OF CARE: 1:40 PM-Discussed treatment plan with pt at bedside and pt agreed to plan.   Labs Review Labs Reviewed  CBC  BASIC METABOLIC PANEL    Imaging Review US Soft Tissue Head/neck  08/16/2015  CLINICAL DATA:  40 year old female with right-sided neck mass EXAM: THYROID ULTRASOUND TECHNIQUE: Ultrasound examination of the thyroid gland and adjacent soft tissues was performed. COMPARISON:  None FINDINGS: Right thyroid lobe Measurements: 5.3 x 2.2 x 2.4 cm. Dominant solid minimally hypoechoic nodule with some internal cystic degeneration in the right mid  gland measures 3.7 x 1.9 x 2.2 cm. A smaller hypoechoic solid nodule in the upper gland measures 1.0 x 0.6 x 0.8 cm. Left thyroid lobe Measurements: 5.2 x 1.2 x 1.1 cm. Tiny hypoechoic nodules in the left mid to upper gland measuring no larger than 3 mm. Isthmus Thickness: 0.4 cm.  No nodules visualized. Lymphadenopathy None visualized. IMPRESSION: A dominant 3.7 cm minimally hypoechoic predominantly solid nodule in the right mid gland meets consensus criteria for further evaluation. Findings meet consensus criteria for biopsy. Ultrasound-guided fine needle aspiration should be considered, as per the consensus statement: Management of Thyroid Nodules Detected at [place]: Society of Radiologists in [place]. Radiology 2005; [id number]. Smaller 1 cm hypoechoic solid nodule in the upper pole at can be observed on routine follow-up imaging. Electronically Signed   By: Jacqulynn Cadet M.D.   On: 08/16/2015 16:29    I have personally reviewed and evaluated these images and lab results as part of my medical decision-making.  MDM   Final diagnoses:  Mass of right side of neck  Thyroid nodule   Patient nontoxic appearing, VSS. Neck mass on physical exam. Will ultrasound.  CBC, BMP, TSH, free T4 unremarkable.  Ultrasound of neck demonstrates a dominant 3.7 cm minimally hypoechoic predominantly solid nodule in the right mid gland meets consensus criteria for further evaluation. Findings meet consensus criteria for biopsy. Smaller 1 cm hypoechoic solid nodule in the upper pole at can be observed on routine follow-up imaging. Discussed results with patient and referred to IR for biopsy, as well as general surgery (if needed). Patient may be safely discharged home. Discussed reasons for return. Patient to follow-up with primary care provider within one week. Patient in understanding and agreement with the plan.  I personally performed the services described in this documentation,  which was scribed in my presence. The recorded information has been reviewed and is accurate.  Entiat Lions, PA-C 08/23/15 [place], MD 08/24/15 [phone]

## 2015-08-16 NOTE — ED Notes (Signed)
Pt noticed swelling in right sided of throat last pm. Martin Majestic to PCP. Told to come to ED for work up for Thyroid disorder.

## 2015-08-16 NOTE — Discharge Instructions (Signed)
Ms. Seeta Raymon,  Nice meeting you! Please follow-up with interventional radiology (Dr. Anselm Pancoast) and/or general surgery ([facility]). Return to the emergency department if you develop fevers, chills, new/worsening symptoms. Feel better soon!  S. Wendie Simmer, PA-C

## 2015-08-16 NOTE — ED Notes (Signed)
Pt reports intermittent sore throats which she thought was related to allergies. Pt reports she has now started to notice a lump on the right side of her neck and is having some difficulty swallowing. Airway intact. Pt alert x4.

## 2015-08-17 ENCOUNTER — Telehealth: Payer: Self-pay | Admitting: *Deleted

## 2015-08-17 NOTE — Telephone Encounter (Signed)
EDCM called to inform pt how to get referral for surgery/biopsy.  Left message for pt to call [facility] back.

## 2015-08-18 ENCOUNTER — Ambulatory Visit: Payer: Commercial Managed Care - HMO | Admitting: Primary Care

## 2015-08-19 ENCOUNTER — Other Ambulatory Visit: Payer: Self-pay | Admitting: General Surgery

## 2015-08-19 DIAGNOSIS — E041 Nontoxic single thyroid nodule: Secondary | ICD-10-CM

## 2015-08-24 ENCOUNTER — Ambulatory Visit (HOSPITAL_COMMUNITY)
Admission: RE | Admit: 2015-08-24 | Discharge: 2015-08-24 | Disposition: A | Discharge status: Home / Self Care | Payer: Commercial Managed Care - HMO | Source: Ambulatory Visit | Attending: General Surgery | Admitting: General Surgery

## 2015-08-24 DIAGNOSIS — E041 Nontoxic single thyroid nodule: Secondary | ICD-10-CM | POA: Insufficient documentation

## 2015-08-24 MED ORDER — LIDOCAINE HCL (PF) 1 % IJ SOLN
INTRAMUSCULAR | Status: AC
  Filled 2015-08-24: qty 10

## 2015-08-24 NOTE — Procedures (Signed)
  US guided Right thyroid nodule biopsy  25 g x 4  tolerated well

## 2015-09-21 ENCOUNTER — Telehealth: Payer: Self-pay | Admitting: *Deleted

## 2015-09-21 NOTE — Telephone Encounter (Signed)
Pt unable to come by early am. She will come by after lunch to drop a urine specimen off.

## 2015-09-21 NOTE — Telephone Encounter (Signed)
Patient called and is experiencing lower back pain , frequent urination. The patient took a Z pack and her symptoms cleared up. Patient noticed the last couple of days her symptoms have returned. She was wondering if she needed to come in for a urine sample or if Melody can prescribe her something. Patient call back number is [phone] work  and [phone] ( cell)

## 2015-09-23 ENCOUNTER — Ambulatory Visit (INDEPENDENT_AMBULATORY_CARE_PROVIDER_SITE_OTHER): Payer: Commercial Managed Care - HMO | Admitting: Endocrinology

## 2015-09-23 ENCOUNTER — Encounter: Payer: Self-pay | Admitting: Endocrinology

## 2015-09-23 VITALS — BP 112/78 | HR 73 | Wt 146.0 lb

## 2015-09-23 DIAGNOSIS — E041 Nontoxic single thyroid nodule: Secondary | ICD-10-CM

## 2015-09-23 MED ORDER — LEVOTHYROXINE SODIUM 50 MCG PO TABS: 50.0000 ug | ORAL_TABLET | Freq: Every day | ORAL | Status: DC

## 2015-09-23 NOTE — Progress Notes (Signed)
Patient ID: Leah Thompson, female   DOB: Aug 23, 1975, 40 y.o.   MRN: [id number]           Reason for Appointment: Evaluation of thyroid nodule  Referring physician: Dr. Greer Pickerel    History of Present Illness:   The patient's thyroid nodule was first discovered by herself  when she felt her neck although there was no local discomfort.  This was about the first day of May She was also seen in the ER.  Thyroid functions were checked and were normal Her PCP referred her to a surgeon and an ultrasound was done to evaluate the nodule    The thyroid ultrasound showed the following:  Dominant solid minimally hypoechoic nodule with some internal cystic degeneration in the right mid gland measures 3.7 x 1.9 x 2.2 cm. A smaller hypoechoic solid nodule in the upper gland measures 1.0 x 0.6 x 0.8 cm.  Needle aspiration of this nodule showed benign follicular adenoma. She is now here for further management and follow-up, referred at the recommendation of her surgeon  She thinks she still feels the nodule and is concerned about potential growth of this and wants further recommendations   Lab Results  Component Value Date   FREET4 0.67 08/16/2015   TSH 1.117 08/16/2015      Medication List       This list is accurate as of: 09/23/15 11:59 PM.  Always use your most recent med list.               levothyroxine 50 MCG tablet  Commonly known as:  SYNTHROID, LEVOTHROID  Take 1 tablet (50 mcg total) by mouth daily.     LORazepam 1 MG tablet  Commonly known as:  ATIVAN  Take 1 tablet (1 mg total) by mouth every 8 (eight) hours.        Allergies: No Known Allergies  Past Medical History  Diagnosis Date  . Anxiety   . HELLP syndrome, complicating childbirth   . HELLP syndrome      Past Surgical History  Procedure Laterality Date  . Cesarean section  02/05/14    HELLP syndrome  . Tonsillectomy  2005    Family History  Problem Relation Age of Onset  . Heart disease  Father     Social History:  reports that she has quit smoking. She has never used smokeless tobacco. She reports that she does not drink alcohol or use illicit drugs.   Review of Systems:  She had been concerned about her weight and had taken phentermine from her PCP  She has had occasional hot flashes  No unusual fatigue  She has had occasional anxiety    Examination:   BP 112/78 mmHg  Pulse 73  Wt 146 lb (66.225 kg)  SpO2 97%   General Appearance:  well-looking        Eyes: No abnormal prominence or swelling of the eyes         THYROID: Thyroid nodule is palpable on the right, 3.5 cm across, smooth, slightly firm.  Left lobe is not palpable.  There is no lymphadenopathy in the neck  Heart sounds normal Reflexes at biceps are normal.  Skin: No rash or lesions Extremities: No edema  Assessment/Plan:  Thyroid nodule:  She has a benign 3.7 cm thyroid nodule Has no local discomfort, pressure sensation or difficulty swallowing with this Patient is concerned about the size of the nodule and would like to try thyroid suppression to see if this  will help reduce the size or prevent further growth. Discussed nature of thyroid nodules and natural history  She will be given a trial of 50 g levothyroxine for about 6 months to see the nodule decreases in size Also discussed that it is rare that benign nodules need surgery  She will come back in 2 months for follow-up and reassessment of her thyroid levels  Will also plan to do an ultrasound and about a year because of the large size of the thyroid nodule   [facility] 09/25/2015

## 2015-09-25 DIAGNOSIS — E041 Nontoxic single thyroid nodule: Secondary | ICD-10-CM | POA: Insufficient documentation

## 2015-11-21 ENCOUNTER — Telehealth: Payer: Self-pay | Admitting: Obstetrics and Gynecology

## 2015-11-21 NOTE — Telephone Encounter (Signed)
Patient called requesting a refill on ativan.Thanks

## 2015-11-21 NOTE — Telephone Encounter (Signed)
pls advise

## 2015-11-22 NOTE — Telephone Encounter (Signed)
Please remind her to have pharmacy send refill request.

## 2015-11-28 ENCOUNTER — Ambulatory Visit (INDEPENDENT_AMBULATORY_CARE_PROVIDER_SITE_OTHER): Payer: Commercial Managed Care - HMO | Admitting: Endocrinology

## 2015-11-28 ENCOUNTER — Other Ambulatory Visit (INDEPENDENT_AMBULATORY_CARE_PROVIDER_SITE_OTHER): Payer: Commercial Managed Care - HMO

## 2015-11-28 VITALS — BP 122/68 | HR 69 | Ht 65.0 in | Wt 146.0 lb

## 2015-11-28 DIAGNOSIS — E041 Nontoxic single thyroid nodule: Secondary | ICD-10-CM

## 2015-11-28 LAB — TSH: TSH: 0.72 u[IU]/mL (ref 0.35–4.50)

## 2015-11-28 LAB — T4, FREE: Free T4: 0.81 ng/dL (ref 0.60–1.60)

## 2015-11-28 NOTE — Progress Notes (Signed)
Patient ID: Leah Thompson, female   DOB: 1975-12-11, 40 y.o.   MRN: [id number]           Reason for Appointment: Follow-up of thyroid nodule  Referring physician: Dr. Greer Pickerel    History of Present Illness:   The patient's thyroid nodule was first discovered by herself in 5/17 when she felt her neck although there was no local discomfort.     The thyroid ultrasound showed the following: Dominant solid minimally hypoechoic nodule with some internal cystic degeneration in the right mid gland measures 3.7 x 1.9 x 2.2 cm. A smaller hypoechoic solid nodule in the upper gland measures 1.0 x 0.6 x 0.8 cm.  Needle aspiration of this nodule showed benign follicular adenoma.  She was concerned about the size of the nodule and management although she did not report any local pressure symptoms or difficulty swallowing She has been on a therapeutic trial of levothyroxine 50 g daily since 6/70 She thinks she does not feel a nodule is as prominent     Lab Results  Component Value Date   FREET4 0.67 08/16/2015   TSH 1.117 08/16/2015      Medication List       Accurate as of 11/28/15  5:15 PM. Always use your most recent med list.          levothyroxine 50 MCG tablet Commonly known as:  SYNTHROID, LEVOTHROID Take 1 tablet (50 mcg total) by mouth daily.   LORazepam 1 MG tablet Commonly known as:  ATIVAN Take 1 tablet (1 mg total) by mouth every 8 (eight) hours.       Allergies: No Known Allergies  Past Medical History:  Diagnosis Date  . Anxiety   . HELLP syndrome   . HELLP syndrome, complicating childbirth      Past Surgical History:  Procedure Laterality Date  . CESAREAN SECTION  02/05/14   HELLP syndrome  . TONSILLECTOMY  2005    Family History  Problem Relation Age of Onset  . Heart disease Father     Social History:  reports that she has quit smoking. She has never used smokeless tobacco. She reports that she does not drink alcohol or use  drugs.   Review of Systems:  No new problems    Examination:   BP 122/68   Pulse 69   Ht 5\' 5"  (1.651 m)   Wt 146 lb (66.2 kg)   SpO2 98%   BMI 24.30 kg/m          THYROID: Thyroid nodule is palpable on the right, about 3 cm across, smooth, slightly firm.  Left lobe is not palpable.  There is no lymphadenopathy in the neck  Biceps reflexes appear normal  Assessment/Plan:  Thyroid nodule on the right, benign follicular adenoma by needle biopsy  She has been on a therapeutic trial of levothyroxine She thinks that subjectively the nodule is less prominent and it does appear to be slightly smaller in measurement as well as less prominent on inspection and palpation today Will check her TSH today and adjust her dose as needed  Will also plan to do an ultrasound about a year because of the large size of the thyroid nodule   [facility] 11/28/2015

## 2015-11-29 ENCOUNTER — Telehealth: Payer: Self-pay | Admitting: Endocrinology

## 2015-11-29 NOTE — Telephone Encounter (Signed)
Patient is calling for the result of her lab work. °

## 2015-11-29 NOTE — Progress Notes (Signed)
Please let patient know that the thyroid level is on the higher side of normal and would like to continue same doses of levothyroxine Please schedule follow-up in 6 months with labs

## 2015-11-29 NOTE — Telephone Encounter (Signed)
I contacted the pt and advised via vm her thyroid levels are high normal and to please continue the same medication dosage at this time. Pt advised to call back to schedule her 6 month follow with labs prior to the visit.

## 2015-11-30 ENCOUNTER — Other Ambulatory Visit: Payer: Self-pay

## 2015-11-30 NOTE — Telephone Encounter (Signed)
PT called back she has a couple of questions regarding the medication that she was put on, requests call back at [phone]

## 2015-12-02 NOTE — Telephone Encounter (Signed)
She cannot take any other medication and if she wants to stop the medication that is okay.  Is not affecting her fertility

## 2015-12-02 NOTE — Telephone Encounter (Signed)
I contacted the pt. Pt stated she is concerned about being on the levothyroxine 50 mcg and trying to get pregnant. Pt wanted to know if there is alternative medication she could take that would be a lower dosage. Also, she would like to know your opinion on if the medication along with her levels could be hindering her from becoming pregnant. Please advise, Thanks!

## 2015-12-02 NOTE — Telephone Encounter (Signed)
I contacted the pt and advised of MD's instructions. Request a call back if the pt would like to discuss.

## 2015-12-28 ENCOUNTER — Telehealth: Payer: Self-pay | Admitting: Endocrinology

## 2015-12-28 NOTE — Telephone Encounter (Signed)
Patient said that her headaches didn't start until she started taking the medications, she said she has read online that headaches are one of the major side affects from this medication.  She said she feels like our office is making her feel like she's crazy when ever she calls and says anything about it.  Please advise.

## 2015-12-28 NOTE — Telephone Encounter (Signed)
PT called and said that ever since she has been on this new medication, she has had splitting headaches.  She has tried ibuprofen, tylenol, anything OTC and nothing is helping.  She would like a call back to discuss. [phone]

## 2015-12-28 NOTE — Telephone Encounter (Signed)
Please see below and advise.

## 2015-12-28 NOTE — Telephone Encounter (Signed)
Headaches have no connection with her thyroid medication, needs to see PCP

## 2015-12-28 NOTE — Telephone Encounter (Signed)
She can try to stop the thyroid supplement.  However she will probably need to see the PCP if headaches continue, her thyroid levels have not been excessive with the medication

## 2015-12-28 NOTE — Telephone Encounter (Signed)
Noted, patient is aware. 

## 2016-01-29 ENCOUNTER — Other Ambulatory Visit: Payer: Self-pay | Admitting: Endocrinology

## 2016-02-16 ENCOUNTER — Other Ambulatory Visit: Payer: Self-pay | Admitting: General Surgery

## 2016-02-16 DIAGNOSIS — E041 Nontoxic single thyroid nodule: Secondary | ICD-10-CM

## 2016-03-15 ENCOUNTER — Ambulatory Visit
Admission: RE | Admit: 2016-03-15 | Discharge: 2016-03-15 | Disposition: A | Discharge status: Home / Self Care | Payer: Commercial Managed Care - HMO | Source: Ambulatory Visit | Attending: General Surgery | Admitting: General Surgery

## 2016-03-15 DIAGNOSIS — E041 Nontoxic single thyroid nodule: Secondary | ICD-10-CM

## 2016-03-21 ENCOUNTER — Other Ambulatory Visit: Payer: Self-pay | Admitting: *Deleted

## 2016-03-21 ENCOUNTER — Telehealth: Payer: Self-pay | Admitting: Obstetrics and Gynecology

## 2016-03-21 MED ORDER — LORAZEPAM 1 MG PO TABS: 1.0000 mg | ORAL_TABLET | Freq: Three times a day (TID) | ORAL | 2 refills | Status: DC

## 2016-03-21 NOTE — Telephone Encounter (Signed)
Put on [facility] desk

## 2016-03-21 NOTE — Telephone Encounter (Signed)
Pt said said needs refill on Ativan and she has moved and wants it sent to [facility]

## 2016-03-29 ENCOUNTER — Other Ambulatory Visit (INDEPENDENT_AMBULATORY_CARE_PROVIDER_SITE_OTHER): Payer: Commercial Managed Care - HMO

## 2016-03-29 DIAGNOSIS — E041 Nontoxic single thyroid nodule: Secondary | ICD-10-CM

## 2016-03-29 LAB — T4, FREE: Free T4: 0.98 ng/dL (ref 0.60–1.60)

## 2016-03-29 LAB — TSH: TSH: 0.38 u[IU]/mL (ref 0.35–4.50)

## 2016-04-02 ENCOUNTER — Ambulatory Visit: Payer: Commercial Managed Care - HMO | Admitting: Endocrinology

## 2016-04-11 ENCOUNTER — Telehealth: Payer: Self-pay | Admitting: Endocrinology

## 2016-04-11 NOTE — Telephone Encounter (Signed)
See message, could you review and please advise during Dr. Ronnie Derby absence? Thanks!

## 2016-04-11 NOTE — Telephone Encounter (Signed)
Pt is asking for her lab results from 03/29/16 please

## 2016-04-12 NOTE — Telephone Encounter (Signed)
Her TFTs were normal. Continue current dose of levothyroxine.

## 2016-04-12 NOTE — Telephone Encounter (Signed)
I contacted the patient and advised of message. Pateint voiced understanding and had no further questions at this time.  

## 2016-05-10 ENCOUNTER — Other Ambulatory Visit: Payer: Self-pay | Admitting: Obstetrics and Gynecology

## 2016-05-10 ENCOUNTER — Encounter: Payer: Self-pay | Admitting: Obstetrics and Gynecology

## 2016-05-10 ENCOUNTER — Encounter: Payer: Commercial Managed Care - HMO | Admitting: Obstetrics and Gynecology

## 2016-05-10 ENCOUNTER — Ambulatory Visit (INDEPENDENT_AMBULATORY_CARE_PROVIDER_SITE_OTHER): Payer: Commercial Managed Care - HMO | Admitting: Obstetrics and Gynecology

## 2016-05-10 VITALS — BP 112/73 | HR 76 | Ht 65.0 in | Wt 148.6 lb

## 2016-05-10 DIAGNOSIS — Z01419 Encounter for gynecological examination (general) (routine) without abnormal findings: Secondary | ICD-10-CM

## 2016-05-10 DIAGNOSIS — Z23 Encounter for immunization: Secondary | ICD-10-CM | POA: Diagnosis not present

## 2016-05-10 NOTE — Patient Instructions (Signed)
 Preventive Care 18-39 Years, Female Preventive care refers to lifestyle choices and visits with your health care provider that can promote health and wellness. What does preventive care include?  A yearly physical exam. This is also called an annual well check.  Dental exams once or twice a year.  Routine eye exams. Ask your health care provider how often you should have your eyes checked.  Personal lifestyle choices, including:  Daily care of your teeth and gums.  Regular physical activity.  Eating a healthy diet.  Avoiding tobacco and drug use.  Limiting alcohol use.  Practicing safe sex.  Taking vitamin and mineral supplements as recommended by your health care provider. What happens during an annual well check? The services and screenings done by your health care provider during your annual well check will depend on your age, overall health, lifestyle risk factors, and family history of disease. Counseling  Your health care provider may ask you questions about your:  Alcohol use.  Tobacco use.  Drug use.  Emotional well-being.  Home and relationship well-being.  Sexual activity.  Eating habits.  Work and work environment.  Method of birth control.  Menstrual cycle.  Pregnancy history. Screening  You may have the following tests or measurements:  Height, weight, and BMI.  Diabetes screening. This is done by checking your blood sugar (glucose) after you have not eaten for a while (fasting).  Blood pressure.  Lipid and cholesterol levels. These may be checked every 5 years starting at age 20.  Skin check.  Hepatitis C blood test.  Hepatitis B blood test.  Sexually transmitted disease (STD) testing.  BRCA-related cancer screening. This may be done if you have a family history of breast, ovarian, tubal, or peritoneal cancers.  Pelvic exam and Pap test. This may be done every 3 years starting at age 21. Starting at age 30, this may be done  every 5 years if you have a Pap test in combination with an HPV test. Discuss your test results, treatment options, and if necessary, the need for more tests with your health care provider. Vaccines  Your health care provider may recommend certain vaccines, such as:  Influenza vaccine. This is recommended every year.  Tetanus, diphtheria, and acellular pertussis (Tdap, Td) vaccine. You may need a Td booster every 10 years.  Varicella vaccine. You may need this if you have not been vaccinated.  HPV vaccine. If you are 26 or younger, you may need three doses over 6 months.  Measles, mumps, and rubella (MMR) vaccine. You may need at least one dose of MMR. You may also need a second dose.  Pneumococcal 13-valent conjugate (PCV13) vaccine. You may need this if you have certain conditions and were not previously vaccinated.  Pneumococcal polysaccharide (PPSV23) vaccine. You may need one or two doses if you smoke cigarettes or if you have certain conditions.  Meningococcal vaccine. One dose is recommended if you are age 19-21 years and a first-year college student living in a residence hall, or if you have one of several medical conditions. You may also need additional booster doses.  Hepatitis A vaccine. You may need this if you have certain conditions or if you travel or work in places where you may be exposed to hepatitis A.  Hepatitis B vaccine. You may need this if you have certain conditions or if you travel or work in places where you may be exposed to hepatitis B.  Haemophilus influenzae type b (Hib) vaccine. You may need   this if you have certain risk factors. Talk to your health care provider about which screenings and vaccines you need and how often you need them. This information is not intended to replace advice given to you by your health care provider. Make sure you discuss any questions you have with your health care provider. Document Released: 05/29/2001 Document Revised:  12/21/2015 Document Reviewed: 02/01/2015 Elsevier Interactive Patient Education  2017 Elsevier Inc.  

## 2016-05-10 NOTE — Progress Notes (Addendum)
Subjective:   Leah Thompson is a 41 y.o. G71P0011 Caucasian female here for a routine well-woman exam.  Patient's last menstrual period was 04/24/2016.    Current complaints: none PCP: none       does desire labs  Social History: Sexual: heterosexual Marital Status: married Living situation: with family Occupation: unknown occupation Tobacco/alcohol: no tobacco use Illicit drugs: no history of illicit drug use  The following portions of the patient's history were reviewed and updated as appropriate: allergies, current medications, past family history, past medical history, past social history, past surgical history and problem list.  Past Medical History Past Medical History:  Diagnosis Date  . Anxiety   . HELLP syndrome   . HELLP syndrome, complicating childbirth     Past Surgical History Past Surgical History:  Procedure Laterality Date  . CESAREAN SECTION  02/05/14   HELLP syndrome  . TONSILLECTOMY  2005    Gynecologic History [id number]  Patient's last menstrual period was 04/24/2016. Contraception: none Last Pap: 2016. Results were: normal  Obstetric History OB History  Gravida Para Term Preterm AB Living  2       1 1   SAB TAB Ectopic Multiple Live Births  1       1    # Outcome Date GA Lbr Len/2nd Weight Sex Delivery Anes PTL Lv  2 Gravida 02/05/14    F CS-Unspec   LIV  1 SAB               Current Medications Current Outpatient Prescriptions on File Prior to Visit  Medication Sig Dispense Refill  . levothyroxine (SYNTHROID, LEVOTHROID) 50 MCG tablet TAKE 1 TABLET BY MOUTH DAILY. 30 tablet 3  . LORazepam (ATIVAN) 1 MG tablet Take 1 tablet (1 mg total) by mouth every 8 (eight) hours. 50 tablet 2   No current facility-administered medications on file prior to visit.     Review of Systems Patient denies any headaches, blurred vision, shortness of breath, chest pain, abdominal pain, problems with bowel movements, urination, or intercourse.  Objective:   BP 112/73   Pulse 76   Ht 5\' 5"  (1.651 m)   Wt 148 lb 9.6 oz (67.4 kg)   LMP 04/24/2016   BMI 24.73 kg/m  Physical Exam  General:  Well developed, well nourished, no acute distress. She is alert and oriented x3. Skin:  Warm and dry Neck:  Midline trachea, large thyroid nodule on right side (not new) Cardiovascular: Regular rate and rhythm, no murmur heard Lungs:  Effort normal, all lung fields clear to auscultation bilaterally Breasts:  No dominant palpable mass, retraction, or nipple discharge; bilateral breast implants without defect Abdomen:  Soft, non tender, no hepatosplenomegaly or masses Pelvic:  External genitalia is normal in appearance.  The vagina is normal in appearance. The cervix is bulbous, no CMT.  Thin prep pap is done with HR HPV cotesting. Uterus is felt to be normal size, shape, and contour.  No adnexal masses or tenderness noted. Extremities:  No swelling or varicosities noted Psych:  She has a normal mood and affect  Assessment:   Healthy well-woman exam Flu vaccine needed Thyroid nodule   Plan:  Labs obtained Flu vaccine declined F/U 1 year  for AE, or sooner if needed   Rick Warnick Rockney Ghee, CNM

## 2016-05-11 LAB — CYTOLOGY - PAP

## 2016-05-15 ENCOUNTER — Telehealth: Payer: Self-pay | Admitting: *Deleted

## 2016-05-15 NOTE — Telephone Encounter (Signed)
Notified pt. 

## 2016-05-15 NOTE — Telephone Encounter (Signed)
-----   Message from Joylene Igo, [place] sent at 05/14/2016 10:26 AM EST ----- Pap and HPV were both negative

## 2016-05-31 ENCOUNTER — Encounter: Payer: Commercial Managed Care - HMO | Admitting: Obstetrics and Gynecology

## 2016-06-06 ENCOUNTER — Encounter: Payer: BLUE CROSS/BLUE SHIELD | Admitting: Obstetrics and Gynecology

## 2016-06-07 ENCOUNTER — Other Ambulatory Visit: Payer: Self-pay | Admitting: Endocrinology

## 2016-06-07 DIAGNOSIS — E041 Nontoxic single thyroid nodule: Secondary | ICD-10-CM

## 2016-06-08 ENCOUNTER — Telehealth: Payer: Self-pay | Admitting: Endocrinology

## 2016-06-08 ENCOUNTER — Other Ambulatory Visit: Payer: Self-pay

## 2016-06-08 MED ORDER — LEVOTHYROXINE SODIUM 50 MCG PO TABS: 50.0000 ug | ORAL_TABLET | Freq: Every day | ORAL | 3 refills | Status: DC

## 2016-06-08 NOTE — Telephone Encounter (Signed)
Refill submitted. 

## 2016-06-08 NOTE — Telephone Encounter (Signed)
Refill of   levothyroxine (SYNTHROID, LEVOTHROID) 50 MCG tablet   [facility] [phone]

## 2016-06-11 ENCOUNTER — Other Ambulatory Visit (INDEPENDENT_AMBULATORY_CARE_PROVIDER_SITE_OTHER): Payer: Commercial Managed Care - HMO

## 2016-06-11 DIAGNOSIS — E041 Nontoxic single thyroid nodule: Secondary | ICD-10-CM

## 2016-06-11 LAB — T4, FREE: Free T4: 0.79 ng/dL (ref 0.60–1.60)

## 2016-06-11 LAB — TSH: TSH: 0.76 u[IU]/mL (ref 0.35–4.50)

## 2016-06-14 ENCOUNTER — Encounter: Payer: Self-pay | Admitting: Endocrinology

## 2016-06-14 ENCOUNTER — Ambulatory Visit (INDEPENDENT_AMBULATORY_CARE_PROVIDER_SITE_OTHER): Payer: Commercial Managed Care - HMO | Admitting: Endocrinology

## 2016-06-14 VITALS — HR 78 | Ht 65.0 in | Wt 149.0 lb

## 2016-06-14 DIAGNOSIS — E041 Nontoxic single thyroid nodule: Secondary | ICD-10-CM

## 2016-06-14 NOTE — Progress Notes (Signed)
Patient ID: Leah Thompson, female   DOB: [birth year], 41 y.o.   MRN: [id number]           Reason for Appointment: Follow-up of thyroid nodule  Referring physician: Dr. Greer Pickerel    History of Present Illness:   The patient's thyroid nodule was first discovered by herself in 5/17 when she felt her neck     The thyroid ultrasound showed the following: Dominant solid minimally hypoechoic nodule with some internal cystic degeneration in the right mid gland measures 3.7 x 1.9 x 2.2 cm.  A smaller hypoechoic solid nodule in the upper gland measures 1.0 x 0.6 x 0.8 cm.  Needle aspiration of this nodule showed benign follicular adenoma.  She has been on a therapeutic trial of levothyroxine 50 g daily since 6/17 to see if it will help reduce the size of the nodule She thinks she does not feel the nodule is as prominent since starting the thyroid suppression  Since she was concerned about the nodule another ultrasound was done in 11/17 and this showed no significant change   Lab Results  Component Value Date   FREET4 0.79 06/11/2016   FREET4 0.98 03/29/2016   FREET4 0.81 11/28/2015   TSH 0.76 06/11/2016   TSH 0.38 03/29/2016   TSH 0.72 11/28/2015    Allergies as of 06/14/2016   No Known Allergies     Medication List       Accurate as of 06/14/16 10:48 AM. Always use your most recent med list.          levothyroxine 50 MCG tablet Commonly known as:  SYNTHROID, LEVOTHROID Take 1 tablet (50 mcg total) by mouth daily.   LORazepam 1 MG tablet Commonly known as:  ATIVAN Take 1 tablet (1 mg total) by mouth every 8 (eight) hours.       Allergies: No Known Allergies  Past Medical History:  Diagnosis Date  . Anxiety   . HELLP syndrome   . HELLP syndrome, complicating childbirth      Past Surgical History:  Procedure Laterality Date  . CESAREAN SECTION  02/05/14   HELLP syndrome  . TONSILLECTOMY  2005    Family History  Problem Relation Age of Onset  . Heart  disease Father     Social History:  reports that she has quit smoking. She has never used smokeless tobacco. She reports that she does not drink alcohol or use drugs.   Review of Systems:  She is concerned about difficulty conceiving and is asking about levothyroxine interfering with this   Examination:   Pulse 78   Ht 5\' 5"  (1.651 m)   Wt 149 lb (67.6 kg)   SpO2 97%   BMI 24.79 kg/m          THYROID: Thyroid nodule is palpable on the right, about 4 cm across, smooth, slightly firm.  Left lobe is barely palpable.  There is no lymphadenopathy in the neck   Assessment/Plan:  Thyroid nodule on the right, benign follicular adenoma by needle biopsy  She has been on a therapeutic trial of levothyroxine The nodule has been about the same size the right lobe is slightly smaller on suppression Ultrasound did not show any significant growth For now since TSH is below 1.0 she can continue same dose of 50 g for suppression  Will also plan to do an ultrasound at the end of this year because of the large size of the thyroid nodule   [facility] 06/14/2016

## 2016-08-05 ENCOUNTER — Other Ambulatory Visit: Payer: Self-pay | Admitting: Endocrinology

## 2016-08-09 ENCOUNTER — Telehealth: Payer: Self-pay | Admitting: Obstetrics and Gynecology

## 2016-08-09 NOTE — Telephone Encounter (Signed)
Patient left a voicemail stating that she needs a refill on her medication.

## 2016-08-14 NOTE — Telephone Encounter (Signed)
Patient lvm stating that she needs a refill on RX LORazepam (ATIVAN) 1 MG tablet, Patient called and requested refill on 08/09/2016 and hasn't heard back. Requested pharmacy is [facility]. Please advise.

## 2016-08-14 NOTE — Telephone Encounter (Signed)
Pt aware mns will have to refill ativan. Message routed to her. Pt aware mns will be back in office 08/15/2016.

## 2016-08-15 ENCOUNTER — Other Ambulatory Visit: Payer: Self-pay | Admitting: Obstetrics and Gynecology

## 2016-08-15 MED ORDER — LORAZEPAM 1 MG PO TABS: 1.0000 mg | ORAL_TABLET | Freq: Three times a day (TID) | ORAL | 2 refills | Status: DC

## 2016-08-15 NOTE — Telephone Encounter (Signed)
RX for Ativan 1 mg #50, rf x2 faxed to pharmacy, pt notified.

## 2016-08-15 NOTE — Telephone Encounter (Signed)
Please let her know it was done today

## 2016-10-12 ENCOUNTER — Other Ambulatory Visit: Payer: Self-pay | Admitting: Endocrinology

## 2016-12-21 ENCOUNTER — Telehealth: Payer: Self-pay | Admitting: Endocrinology

## 2016-12-21 NOTE — Telephone Encounter (Signed)
Routing to you °

## 2016-12-21 NOTE — Telephone Encounter (Signed)
No need to make a separate trip.  We'll check while you are here.

## 2016-12-21 NOTE — Telephone Encounter (Signed)
Patient called in reference to RT thyroid nodule feeling larger and wanting to get this checked. Scheduled patient for Wednesday 12/26/16. Patient wanted to know if she needed to have labs done prior. Please call patient and advise if so.

## 2016-12-24 NOTE — Telephone Encounter (Signed)
Left message on vm to advise.

## 2016-12-25 NOTE — Progress Notes (Signed)
Patient ID: Leah Thompson, female   DOB: 1975-12-18, 41 y.o.   MRN: [id number]           Reason for Appointment: Follow-up of thyroid nodule  Referring physician: Dr. Greer Pickerel    History of Present Illness:   The patient's thyroid nodule was first discovered by herself in 5/17 when she felt her neck     The thyroid ultrasound showed the following: Dominant solid minimally hypoechoic nodule with some internal cystic degeneration in the right mid gland measures 3.7 x 1.9 x 2.2 cm.  A smaller hypoechoic solid nodule in the upper gland measures 1.0 x 0.6 x 0.8 cm.  Needle aspiration of this nodule showed benign follicular adenoma.  She has been on a therapeutic trial of levothyroxine 50 g daily since 6/17 to see if it will help reduce the size of the nodule She thinks she does not feel the nodule is as  Although she thought that the thyroid nodule is not as prominent since starting the thyroid suppression she is coming in now because of her concern that the nodule is looking larger especially with her colleagues or family members noticing more swelling She has no significant difficulty with swallowing or any unusual pressure sensation  Her last ultrasound was done in 11/17 and this showed no significant change  Labs:  Lab Results  Component Value Date   TSH 0.76 06/11/2016   TSH 0.38 03/29/2016   TSH 0.72 11/28/2015   FREET4 0.79 06/11/2016   FREET4 0.98 03/29/2016   FREET4 0.81 11/28/2015     Allergies as of 12/26/2016   No Known Allergies     Medication List       Accurate as of 12/26/16  9:21 AM. Always use your most recent med list.          levothyroxine 50 MCG tablet Commonly known as:  SYNTHROID, LEVOTHROID TAKE 1 TABLET BY MOUTH EVERY DAY   LORazepam 1 MG tablet Commonly known as:  ATIVAN Take 1 tablet (1 mg total) by mouth every 8 (eight) hours.   meloxicam 7.5 MG tablet Commonly known as:  MOBIC Take 7.5 mg by mouth as needed.        Allergies: No Known Allergies  Past Medical History:  Diagnosis Date  . Anxiety   . HELLP syndrome   . HELLP syndrome, complicating childbirth      Past Surgical History:  Procedure Laterality Date  . CESAREAN SECTION  02/05/14   HELLP syndrome  . TONSILLECTOMY  2005    Family History  Problem Relation Age of Onset  . Heart disease Father     Social History:  reports that she has quit smoking. She has never used smokeless tobacco. She reports that she does not drink alcohol or use drugs.   Review of Systems:  She is concerned about difficulty conceiving and is asking about levothyroxine interfering with this   Examination:   BP 108/62   Pulse 74   Ht 5\' 5"  (1.651 m)   Wt 148 lb 3.2 oz (67.2 kg)   SpO2 98%   BMI 24.66 kg/m          THYROID: Thyroid nodule is palpable on the right, about 4.5 cm across, smooth, slightly firm.  Left lobe is not clearly palpable.  There is no lymphadenopathy in the neck   Assessment/Plan:  Thyroid nodule on the right, benign follicular adenoma by needle biopsy  She has been on a therapeutic trial of levothyroxine with TSH  less than 1 on 50 g daily Both subjectively and objectively the nodule appears to be slightly larger than about 6 months ago She does not have any local pressure symptoms But she is concerned about progressive growth long-term, not considering surgery at this time  Will plan to do an ultrasound to check on the size of the nodule and consider biopsy there is a significant change   [facility] 12/26/2016

## 2016-12-26 ENCOUNTER — Ambulatory Visit (INDEPENDENT_AMBULATORY_CARE_PROVIDER_SITE_OTHER): Payer: Commercial Managed Care - HMO | Admitting: Endocrinology

## 2016-12-26 ENCOUNTER — Encounter: Payer: Self-pay | Admitting: Endocrinology

## 2016-12-26 VITALS — BP 108/62 | HR 74 | Ht 65.0 in | Wt 148.2 lb

## 2016-12-26 DIAGNOSIS — E041 Nontoxic single thyroid nodule: Secondary | ICD-10-CM

## 2016-12-26 DIAGNOSIS — R5383 Other fatigue: Secondary | ICD-10-CM

## 2017-01-01 ENCOUNTER — Other Ambulatory Visit: Payer: Commercial Managed Care - HMO

## 2017-01-07 ENCOUNTER — Telehealth: Payer: Self-pay | Admitting: Obstetrics and Gynecology

## 2017-01-07 NOTE — Telephone Encounter (Signed)
Pt contacted office for refill request on the following medications: LORazepam (ATIVAN) 1 MG tablet [facility] [address] Please advise. Thanks TNP

## 2017-01-08 ENCOUNTER — Other Ambulatory Visit: Payer: Self-pay | Admitting: *Deleted

## 2017-01-08 MED ORDER — LORAZEPAM 1 MG PO TABS: 1.0000 mg | ORAL_TABLET | Freq: Three times a day (TID) | ORAL | 2 refills | Status: DC

## 2017-01-08 NOTE — Telephone Encounter (Signed)
Done-ac 

## 2017-01-11 ENCOUNTER — Telehealth: Payer: Self-pay | Admitting: Endocrinology

## 2017-01-11 NOTE — Telephone Encounter (Signed)
[facility] needs the order for the ultrasound soft tissue non thyroid to be changed to ultrasound thyroid.

## 2017-01-11 NOTE — Telephone Encounter (Signed)
Please review

## 2017-01-14 ENCOUNTER — Other Ambulatory Visit: Payer: Self-pay | Admitting: Endocrinology

## 2017-01-14 DIAGNOSIS — E041 Nontoxic single thyroid nodule: Secondary | ICD-10-CM

## 2017-01-14 NOTE — Telephone Encounter (Signed)
Please see note and let me know when changed so I can call them. Thanks!

## 2017-01-14 NOTE — Telephone Encounter (Signed)
[facility], pressed option 1 then pressed option 5, spoke to Perry and let her know that the orders have been updated by Dr. Dwyane Dee.

## 2017-01-14 NOTE — Telephone Encounter (Signed)
This was done.

## 2017-01-25 ENCOUNTER — Ambulatory Visit
Admission: RE | Admit: 2017-01-25 | Discharge: 2017-01-25 | Disposition: A | Discharge status: Home / Self Care | Payer: Commercial Managed Care - HMO | Source: Ambulatory Visit | Attending: Endocrinology | Admitting: Endocrinology

## 2017-01-25 ENCOUNTER — Other Ambulatory Visit: Payer: Commercial Managed Care - HMO

## 2017-01-25 DIAGNOSIS — E042 Nontoxic multinodular goiter: Secondary | ICD-10-CM | POA: Diagnosis not present

## 2017-01-25 DIAGNOSIS — E041 Nontoxic single thyroid nodule: Secondary | ICD-10-CM

## 2017-02-01 ENCOUNTER — Telehealth: Payer: Self-pay | Admitting: Endocrinology

## 2017-02-01 ENCOUNTER — Other Ambulatory Visit: Payer: Self-pay | Admitting: Endocrinology

## 2017-02-01 DIAGNOSIS — E041 Nontoxic single thyroid nodule: Secondary | ICD-10-CM

## 2017-02-01 NOTE — Telephone Encounter (Signed)
Please call patient. Patient wants to know why she needs another biopsy. She has more questions. Please call asap.

## 2017-02-01 NOTE — Telephone Encounter (Signed)
Please advise! Patient is very concerned.

## 2017-02-01 NOTE — Telephone Encounter (Signed)
Patient was called and plan discussed, see result note

## 2017-02-08 DIAGNOSIS — E041 Nontoxic single thyroid nodule: Secondary | ICD-10-CM | POA: Diagnosis not present

## 2017-02-12 NOTE — Progress Notes (Signed)
Please place orders in EPIC as patient is being scheduled for a pre-op appointment! Thank you! 

## 2017-02-13 ENCOUNTER — Ambulatory Visit: Payer: Self-pay | Admitting: Surgery

## 2017-02-20 ENCOUNTER — Telehealth: Payer: Self-pay | Admitting: Endocrinology

## 2017-02-20 NOTE — Telephone Encounter (Signed)
Please advise 

## 2017-02-20 NOTE — Telephone Encounter (Signed)
Patient having thyroid surgery on 03/01/17-Dr. Harlow Asa is doing the surgery (removing nodule on right side). Patient wants to know if she has been tested for [facility] before she has surgery, if not, she wants to be tested for the disease. Her thyroid is growing bigger every day. Please call patient at ph# [phone]

## 2017-02-20 NOTE — Telephone Encounter (Signed)
Please let her know that when she has the thyroid nodule removed the pathology will reveal any signs of Hashimoto's thyroiditis anyway and there is no benefit of doing any testing

## 2017-02-21 ENCOUNTER — Telehealth: Payer: Self-pay | Admitting: Obstetrics and Gynecology

## 2017-02-21 NOTE — Telephone Encounter (Signed)
pls advise

## 2017-02-21 NOTE — Telephone Encounter (Signed)
Patient called and would like to have her vitamin and iron levels checked. And the pt is also concerned about her enlarged fibriod that is not the size of an egg. And the pt stated that her "menstrual cycle is out of control " /The patient would like a call back to discuss her concerns and have her questions answered. Please advise.

## 2017-02-21 NOTE — Telephone Encounter (Signed)
There is no medication for that purpose, she has already tried levothyroxine

## 2017-02-21 NOTE — Telephone Encounter (Signed)
Called patient and she wants to know if there is any medication that will reduce the nodule if it is hashimoto's or any other medication that could decrease the nodule without having surgery please advise

## 2017-02-21 NOTE — Telephone Encounter (Signed)
We will discuss all at [place] appointment

## 2017-02-22 ENCOUNTER — Other Ambulatory Visit: Payer: Self-pay

## 2017-02-22 ENCOUNTER — Other Ambulatory Visit: Payer: 59

## 2017-02-22 ENCOUNTER — Other Ambulatory Visit: Payer: Self-pay | Admitting: *Deleted

## 2017-02-22 DIAGNOSIS — R5382 Chronic fatigue, unspecified: Secondary | ICD-10-CM

## 2017-02-22 MED ORDER — LEVOTHYROXINE SODIUM 50 MCG PO TABS: 50.0000 ug | ORAL_TABLET | Freq: Every day | ORAL | 3 refills | Status: DC

## 2017-02-22 NOTE — Telephone Encounter (Signed)
Patient is aware of the note below and I refilled her prescription

## 2017-02-22 NOTE — Telephone Encounter (Signed)
Patient said she is ok with the surgery- has questions about after surgery and they are as follows: Do I need to continue to take levothyroxine after surgery Will my dose be going up after surgery Should I take levothyroxine the day of surgery  Please advise on the patients questions

## 2017-02-22 NOTE — Telephone Encounter (Signed)
The dose of levothyroxine after surgery will depend on the extent of surgery and she can take it on the morning of surgery.  The surgeon will let her know and he will let her know when to see me in follow-up

## 2017-02-25 ENCOUNTER — Encounter (HOSPITAL_COMMUNITY): Payer: Self-pay | Admitting: Surgery

## 2017-02-25 NOTE — H&P (View-Only) (Signed)
General Surgery [facility]  Kimmora Philemon 02/08/2017 1:32 PM Location: [place] Office Patient #: [id number] DOB: Jul 02, 1975 Married / Language: English / Race: White Female   History of Present Illness [name] MD; 02/08/2017 2:44 PM) The patient is a 41 year old female who presents with a complaint of Enlarged thyroid.  CC: enlarging thyroid nodule  patient is referred by Dr. Elayne Snare for enlarging thyroid nodule. Patient has previously been seen in my practice by Dr. Greer Pickerel. Patient initially found a thyroid nodule in the right thyroid lobe on self-examination in early 2017. Patient had lost weight while taking a diet pill. Nodule had become more noticeable. Patient was seen and evaluated and underwent a thyroid ultrasound showing the nodule to measure 3.7 cm. There was a tiny nodule in the left thyroid lobe. Follow-up ultrasound in 6 months demonstrated enlargement to 4.0 cm. Fine needle aspiration biopsy was performed and showed a benign follicular nodule, Bethesda category II. patient was placed on levothyroxine 50 g daily in hopes of suppressing the nodule. Follow-up ultrasound in October 2018 however shows continued enlargement with the nodule now measuring 4.8 x 2.5 x 3.0 cm. TSH level is adequately suppressed at 0.76 on her current dosage of levothyroxine. Patient has begun to notice the nodule more. She feels like it is enlarging. She does have some mild compressive symptoms. She denies any dysphagia or dyspnea. She has no true globus sensation. Patient has had no prior head or neck surgery. She presents today to discuss possible surgical resection.   Problem List/Past Medical [name], MD; 02/08/2017 2:48 PM) RIGHT THYROID NODULE (E04.1)   Past Surgical History [name], MD; 02/08/2017 2:48 PM) Cesarean Section - 1  Oral Surgery  Tonsillectomy   Diagnostic Studies History [name], MD; 02/08/2017  2:48 PM) Colonoscopy  never Mammogram  never Pap Smear  1-5 years ago  Allergies [name], MD; 02/08/2017 2:48 PM) Allergies Reconciled  No Known Drug Allergies 08/19/2015  Medication History Mammie Lorenzo, LPN; 16/60/6301 6:01 PM) Levothyroxine Sodium (50MCG Tablet, Oral) Active. Ativan (1MG  Tablet, Oral) Active. Medications Reconciled  Social History [name], MD; 02/08/2017 2:48 PM) Alcohol use  Occasional alcohol use. Caffeine use  Coffee. No drug use  Tobacco use  Former smoker.  Family History [name], MD; 02/08/2017 2:48 PM) Alcohol Abuse  Mother. Heart Disease  Father. Heart disease in female family member before age 41  Hypertension  Father. Migraine Headache  Mother.  Pregnancy / Birth History [name], MD; 02/08/2017 2:48 PM) Age at menarche  75 years. Gravida  2 Maternal age  47-35 Para  1 Regular periods   Other Problems [name], MD; 02/08/2017 2:48 PM) Anxiety Disorder  High blood pressure  Other disease, cancer, significant illness   Vitals Claiborne Billings Dockery LPN; 09/32/3557 3:22 PM) 02/08/2017 1:45 PM Weight: 150.4 lb Height: 65in Body Surface Area: 1.75 m Body Mass Index: 25.03 kg/m  Temp.: 98.59F(Oral)  Pulse: 76 (Regular)  BP: 118/72 (Sitting, Right Arm, Standard)       Physical Exam [name] MD; 02/08/2017 2:45 PM) The physical exam findings are as follows: Note:CONSTITUTIONAL See vital signs recorded above  GENERAL APPEARANCE Development: normal Nutritional status: normal Gross deformities: none  SKIN Rash, lesions, ulcers: none Induration, erythema: none Nodules: none palpable  EYES Conjunctiva and lids: normal Pupils: equal and reactive Iris: normal bilaterally  EARS, NOSE, MOUTH, THROAT External ears: no lesion or deformity  External nose: no lesion or deformity Hearing: grossly normal Lips: no lesion or deformity Dentition: normal for  age Oral mucosa: moist  NECK Symmetric: no Trachea: slight shift to left due to mass effect Thyroid: dominant mass occupying most of the right thyroid lobe, approximately 5 cm in size, smooth, firm, mobile with swallowing, nontender; left thyroid lobe without palpable abnormality  CHEST Respiratory effort: normal Retraction or accessory muscle use: no Breath sounds: normal bilaterally Rales, rhonchi, wheeze: none  CARDIOVASCULAR Auscultation: regular rhythm, normal rate Murmurs: none Pulses: carotid and radial pulse 2+ palpable Lower extremity edema: none Lower extremity varicosities: none  MUSCULOSKELETAL Station and gait: normal Digits and nails: no clubbing or cyanosis Muscle strength: grossly normal all extremities Range of motion: grossly normal all extremities Deformity: none  LYMPHATIC Cervical: none palpable Supraclavicular: none palpable  PSYCHIATRIC Oriented to person, place, and time: yes Mood and affect: normal for situation Judgment and insight: appropriate for situation    Assessment & Plan [name] MD; 02/08/2017 2:47 PM) RIGHT THYROID NODULE (E04.1) Current Plans Pt Education - Pamphlet Given - The Thyroid Book: discussed with patient and provided information. Patient presents for follow-up of enlarging thyroid nodule. The dominant right thyroid nodule has increased significantly in size over the past 18 months despite thyroid hormone suppression. Patient is now developing mild compressive symptoms. Today we discussed surgical resection.  I have recommended proceeding with right thyroid lobectomy. We discussed the procedure. We discussed risk and benefits including recurrent laryngeal nerve injury and injury to parathyroid glands. We discussed the location of the surgical incision. We discussed the hospital stay and her postoperative recovery. We discussed the possible need for lifelong thyroid hormone replacement. We discussed the  potential need for radioactive iodine treatment. Patient understands and would like to proceed with surgery in January 2019.  The risks and benefits of the procedure have been discussed at length with the patient. The patient understands the proposed procedure, potential alternative treatments, and the course of recovery to be expected. All of the patient's questions have been answered at this time. The patient wishes to proceed with surgery.  Armandina Gemma, [place] [facility] Office: [phone]

## 2017-02-25 NOTE — H&P (Signed)
General Surgery [facility]  Leah Thompson 02/08/2017 1:32 PM Location: [place] Office Patient #: [id number] DOB: 1976-03-28 Married / Language: English / Race: White Female   History of Present Illness Leah Regal MD; 02/08/2017 2:44 PM) The patient is a 41 year old female who presents with a complaint of Enlarged thyroid.  CC: enlarging thyroid nodule  patient is referred by Dr. Elayne Snare for enlarging thyroid nodule. Patient has previously been seen in my practice by Dr. Greer Pickerel. Patient initially found a thyroid nodule in the right thyroid lobe on self-examination in early 2017. Patient had lost weight while taking a diet pill. Nodule had become more noticeable. Patient was seen and evaluated and underwent a thyroid ultrasound showing the nodule to measure 3.7 cm. There was a tiny nodule in the left thyroid lobe. Follow-up ultrasound in 6 months demonstrated enlargement to 4.0 cm. Fine needle aspiration biopsy was performed and showed a benign follicular nodule, Bethesda category II. patient was placed on levothyroxine 50 g daily in hopes of suppressing the nodule. Follow-up ultrasound in October 2018 however shows continued enlargement with the nodule now measuring 4.8 x 2.5 x 3.0 cm. TSH level is adequately suppressed at 0.76 on her current dosage of levothyroxine. Patient has begun to notice the nodule more. She feels like it is enlarging. She does have some mild compressive symptoms. She denies any dysphagia or dyspnea. She has no true globus sensation. Patient has had no prior head or neck surgery. She presents today to discuss possible surgical resection.   Problem List/Past Medical Leah Regal, MD; 02/08/2017 2:48 PM) RIGHT THYROID NODULE (E04.1)   Past Surgical History Leah Regal, MD; 02/08/2017 2:48 PM) Cesarean Section - 1  Oral Surgery  Tonsillectomy   Diagnostic Studies History Leah Regal, MD; 02/08/2017  2:48 PM) Colonoscopy  never Mammogram  never Pap Smear  1-5 years ago  Allergies Leah Regal, MD; 02/08/2017 2:48 PM) Allergies Reconciled  No Known Drug Allergies 08/19/2015  Medication History Mammie Lorenzo, LPN; 53/66/4403 4:74 PM) Levothyroxine Sodium (50MCG Tablet, Oral) Active. Ativan (1MG  Tablet, Oral) Active. Medications Reconciled  Social History Leah Regal, MD; 02/08/2017 2:48 PM) Alcohol use  Occasional alcohol use. Caffeine use  Coffee. No drug use  Tobacco use  Former smoker.  Family History Leah Regal, MD; 02/08/2017 2:48 PM) Alcohol Abuse  Mother. Heart Disease  Father. Heart disease in female family member before age 49  Hypertension  Father. Migraine Headache  Mother.  Pregnancy / Birth History Leah Regal, MD; 02/08/2017 2:48 PM) Age at menarche  53 years. Gravida  2 Maternal age  77-35 Para  1 Regular periods   Other Problems Leah Regal, MD; 02/08/2017 2:48 PM) Anxiety Disorder  High blood pressure  Other disease, cancer, significant illness   Vitals Claiborne Billings Dockery LPN; 25/95/6387 5:64 PM) 02/08/2017 1:45 PM Weight: 150.4 lb Height: 65in Body Surface Area: 1.75 m Body Mass Index: 25.03 kg/m  Temp.: 98.12F(Oral)  Pulse: 76 (Regular)  BP: 118/72 (Sitting, Right Arm, Standard)       Physical Exam Leah Regal MD; 02/08/2017 2:45 PM) The physical exam findings are as follows: Note:CONSTITUTIONAL See vital signs recorded above  GENERAL APPEARANCE Development: normal Nutritional status: normal Gross deformities: none  SKIN Rash, lesions, ulcers: none Induration, erythema: none Nodules: none palpable  EYES Conjunctiva and lids: normal Pupils: equal and reactive Iris: normal bilaterally  EARS, NOSE, MOUTH, THROAT External ears: no lesion or deformity  External nose: no lesion or deformity Hearing: grossly normal Lips: no lesion or deformity Dentition: normal for  age Oral mucosa: moist  NECK Symmetric: no Trachea: slight shift to left due to mass effect Thyroid: dominant mass occupying most of the right thyroid lobe, approximately 5 cm in size, smooth, firm, mobile with swallowing, nontender; left thyroid lobe without palpable abnormality  CHEST Respiratory effort: normal Retraction or accessory muscle use: no Breath sounds: normal bilaterally Rales, rhonchi, wheeze: none  CARDIOVASCULAR Auscultation: regular rhythm, normal rate Murmurs: none Pulses: carotid and radial pulse 2+ palpable Lower extremity edema: none Lower extremity varicosities: none  MUSCULOSKELETAL Station and gait: normal Digits and nails: no clubbing or cyanosis Muscle strength: grossly normal all extremities Range of motion: grossly normal all extremities Deformity: none  LYMPHATIC Cervical: none palpable Supraclavicular: none palpable  PSYCHIATRIC Oriented to person, place, and time: yes Mood and affect: normal for situation Judgment and insight: appropriate for situation    Assessment & Plan Leah Regal MD; 02/08/2017 2:47 PM) RIGHT THYROID NODULE (E04.1) Current Plans Pt Education - Pamphlet Given - The Thyroid Book: discussed with patient and provided information. Patient presents for follow-up of enlarging thyroid nodule. The dominant right thyroid nodule has increased significantly in size over the past 18 months despite thyroid hormone suppression. Patient is now developing mild compressive symptoms. Today we discussed surgical resection.  I have recommended proceeding with right thyroid lobectomy. We discussed the procedure. We discussed risk and benefits including recurrent laryngeal nerve injury and injury to parathyroid glands. We discussed the location of the surgical incision. We discussed the hospital stay and her postoperative recovery. We discussed the possible need for lifelong thyroid hormone replacement. We discussed the  potential need for radioactive iodine treatment. Patient understands and would like to proceed with surgery in January 2019.  The risks and benefits of the procedure have been discussed at length with the patient. The patient understands the proposed procedure, potential alternative treatments, and the course of recovery to be expected. All of the patient's questions have been answered at this time. The patient wishes to proceed with surgery.  Armandina Gemma, [place] [facility] Office: [phone]

## 2017-02-26 ENCOUNTER — Inpatient Hospital Stay (HOSPITAL_COMMUNITY): Admission: RE | Admit: 2017-02-26 | Payer: Commercial Managed Care - HMO | Source: Ambulatory Visit

## 2017-02-27 ENCOUNTER — Encounter (HOSPITAL_COMMUNITY): Payer: Self-pay

## 2017-02-27 NOTE — Patient Instructions (Signed)
[place]  02/27/2017   Your procedure is scheduled on: 03-01-17  Report to [facility]  Entrance Take [place]  elevators to 3rd floor to  [place] at       [phone].    Call this number if you have problems the morning of surgery [phone]   Remember: ONLY 1 PERSON MAY GO WITH YOU TO SHORT STAY TO GET  READY MORNING OF YOUR SURGERY.  Do not eat food or drink liquids :After Midnight.     Take these medicines the morning of surgery with A SIP OF WATER: synthroid                                You may not have any metal on your body including hair pins and              piercings  Do not wear jewelry, make-up, lotions, powders or perfumes, deodorant             Do not wear nail polish.  Do not shave  48 hours prior to surgery.               Do not bring valuables to the hospital. [place].  Contacts, dentures or bridgework may not be worn into surgery.  Leave suitcase in the car. After surgery it may be brought to your room.                Please read over the following fact sheets you were given: _____________________________________________________________________          [facility] - Preparing for Surgery Before surgery, you can play an important role.  Because skin is not sterile, your skin needs to be as free of germs as possible.  You can reduce the number of germs on your skin by washing with CHG (chlorahexidine gluconate) soap before surgery.  CHG is an antiseptic cleaner which kills germs and bonds with the skin to continue killing germs even after washing. Please DO NOT use if you have an allergy to CHG or antibacterial soaps.  If your skin becomes reddened/irritated stop using the CHG and inform your nurse when you arrive at Short Stay. Do not shave (including legs and underarms) for at least 48 hours prior to the first CHG shower.  You may shave your face/neck. Please  follow these instructions carefully:  1.  Shower with CHG Soap the night before surgery and the  morning of Surgery.  2.  If you choose to wash your hair, wash your hair first as usual with your  normal  shampoo.  3.  After you shampoo, rinse your hair and body thoroughly to remove the  shampoo.                           4.  Use CHG as you would any other liquid soap.  You can apply chg directly  to the skin and wash                       Gently with a scrungie or clean washcloth.  5.  Apply the CHG Soap to your body ONLY FROM  THE NECK DOWN.   Do not use on face/ open                           Wound or open sores. Avoid contact with eyes, ears mouth and genitals (private parts).                       Wash face,  Genitals (private parts) with your normal soap.             6.  Wash thoroughly, paying special attention to the area where your surgery  will be performed.  7.  Thoroughly rinse your body with warm water from the neck down.  8.  DO NOT shower/wash with your normal soap after using and rinsing off  the CHG Soap.                9.  Pat yourself dry with a clean towel.            10.  Wear clean pajamas.            11.  Place clean sheets on your bed the night of your first shower and do not  sleep with pets. Day of Surgery : Do not apply any lotions/deodorants the morning of surgery.  Please wear clean clothes to the hospital/surgery center.  FAILURE TO FOLLOW THESE INSTRUCTIONS MAY RESULT IN THE CANCELLATION OF YOUR SURGERY PATIENT SIGNATURE_________________________________  NURSE SIGNATURE__________________________________  ________________________________________________________________________

## 2017-02-28 ENCOUNTER — Encounter (HOSPITAL_COMMUNITY): Payer: Self-pay

## 2017-02-28 ENCOUNTER — Encounter (HOSPITAL_COMMUNITY)
Admission: RE | Admit: 2017-02-28 | Discharge: 2017-02-28 | Disposition: A | Discharge status: Home / Self Care | Payer: 59 | Source: Ambulatory Visit | Attending: Surgery | Admitting: Surgery

## 2017-02-28 ENCOUNTER — Encounter (INDEPENDENT_AMBULATORY_CARE_PROVIDER_SITE_OTHER): Payer: Self-pay

## 2017-02-28 ENCOUNTER — Other Ambulatory Visit: Payer: Self-pay

## 2017-02-28 ENCOUNTER — Ambulatory Visit (HOSPITAL_COMMUNITY)
Admission: RE | Admit: 2017-02-28 | Discharge: 2017-02-28 | Disposition: A | Discharge status: Home / Self Care | Payer: 59 | Source: Ambulatory Visit | Attending: Anesthesiology | Admitting: Anesthesiology

## 2017-02-28 DIAGNOSIS — E041 Nontoxic single thyroid nodule: Secondary | ICD-10-CM | POA: Insufficient documentation

## 2017-02-28 DIAGNOSIS — Z01818 Encounter for other preprocedural examination: Secondary | ICD-10-CM | POA: Insufficient documentation

## 2017-02-28 DIAGNOSIS — R918 Other nonspecific abnormal finding of lung field: Secondary | ICD-10-CM | POA: Diagnosis not present

## 2017-02-28 HISTORY — DX: Unspecified pre-eclampsia, unspecified trimester: O14.90

## 2017-02-28 HISTORY — DX: HELLP syndrome (HELLP), unspecified trimester: O14.20

## 2017-02-28 HISTORY — DX: Headache, unspecified: R51.9

## 2017-02-28 HISTORY — DX: Hypothyroidism, unspecified: E03.9

## 2017-02-28 HISTORY — DX: Headache: R51

## 2017-02-28 LAB — CBC
HEMATOCRIT: 39.7 % (ref 36.0–46.0)
Hemoglobin: 13.3 g/dL (ref 12.0–15.0)
MCH: 31.1 pg (ref 26.0–34.0)
MCHC: 33.5 g/dL (ref 30.0–36.0)
MCV: 93 fL (ref 78.0–100.0)
PLATELETS: 253 10*3/uL (ref 150–400)
RBC: 4.27 MIL/uL (ref 3.87–5.11)
RDW: 12.2 % (ref 11.5–15.5)
WBC: 4.9 10*3/uL (ref 4.0–10.5)

## 2017-02-28 LAB — HCG, SERUM, QUALITATIVE: PREG SERUM: NEGATIVE

## 2017-03-01 ENCOUNTER — Observation Stay (HOSPITAL_COMMUNITY)
Admission: RE | Admit: 2017-03-01 | Discharge: 2017-03-02 | Disposition: A | Discharge status: Home / Self Care | Payer: 59 | Source: Ambulatory Visit | Attending: Surgery | Admitting: Surgery

## 2017-03-01 ENCOUNTER — Ambulatory Visit (HOSPITAL_COMMUNITY): Payer: 59 | Admitting: Anesthesiology

## 2017-03-01 ENCOUNTER — Encounter (HOSPITAL_COMMUNITY): Payer: Self-pay | Admitting: *Deleted

## 2017-03-01 ENCOUNTER — Other Ambulatory Visit: Payer: Self-pay

## 2017-03-01 ENCOUNTER — Encounter (HOSPITAL_COMMUNITY)
Admission: RE | Disposition: A | Discharge status: Home / Self Care | Payer: Self-pay | Source: Ambulatory Visit | Attending: Surgery

## 2017-03-01 DIAGNOSIS — F419 Anxiety disorder, unspecified: Secondary | ICD-10-CM | POA: Diagnosis not present

## 2017-03-01 DIAGNOSIS — Z79899 Other long term (current) drug therapy: Secondary | ICD-10-CM | POA: Insufficient documentation

## 2017-03-01 DIAGNOSIS — I10 Essential (primary) hypertension: Secondary | ICD-10-CM | POA: Insufficient documentation

## 2017-03-01 DIAGNOSIS — E041 Nontoxic single thyroid nodule: Secondary | ICD-10-CM | POA: Diagnosis not present

## 2017-03-01 DIAGNOSIS — E039 Hypothyroidism, unspecified: Secondary | ICD-10-CM | POA: Insufficient documentation

## 2017-03-01 DIAGNOSIS — C73 Malignant neoplasm of thyroid gland: Secondary | ICD-10-CM | POA: Diagnosis not present

## 2017-03-01 DIAGNOSIS — Z87891 Personal history of nicotine dependence: Secondary | ICD-10-CM | POA: Diagnosis not present

## 2017-03-01 HISTORY — PX: THYROID LOBECTOMY: SHX420

## 2017-03-01 SURGERY — LOBECTOMY, THYROID
Anesthesia: General | Site: Neck | Laterality: Right

## 2017-03-01 MED ORDER — PHENYLEPHRINE 40 MCG/ML (10ML) SYRINGE FOR IV PUSH (FOR BLOOD PRESSURE SUPPORT)
PREFILLED_SYRINGE | INTRAVENOUS | Status: DC | PRN
  Administered 2017-03-01 (×2): 40 ug via INTRAVENOUS

## 2017-03-01 MED ORDER — 0.9 % SODIUM CHLORIDE (POUR BTL) OPTIME
TOPICAL | Status: DC | PRN
  Administered 2017-03-01: 1000 mL

## 2017-03-01 MED ORDER — SCOPOLAMINE 1 MG/3DAYS TD PT72
MEDICATED_PATCH | TRANSDERMAL | Status: AC
  Filled 2017-03-01: qty 1

## 2017-03-01 MED ORDER — CEFAZOLIN SODIUM-DEXTROSE 2-4 GM/100ML-% IV SOLN
INTRAVENOUS | Status: AC
  Filled 2017-03-01: qty 100

## 2017-03-01 MED ORDER — ONDANSETRON HCL 4 MG/2ML IJ SOLN
INTRAMUSCULAR | Status: DC | PRN
  Administered 2017-03-01: 4 mg via INTRAVENOUS

## 2017-03-01 MED ORDER — SUGAMMADEX SODIUM 200 MG/2ML IV SOLN
INTRAVENOUS | Status: AC
  Filled 2017-03-01: qty 2

## 2017-03-01 MED ORDER — DIPHENHYDRAMINE HCL 50 MG/ML IJ SOLN
INTRAMUSCULAR | Status: AC
  Filled 2017-03-01: qty 1

## 2017-03-01 MED ORDER — ACETAMINOPHEN 650 MG RE SUPP: 650.0000 mg | Freq: Four times a day (QID) | RECTAL | Status: DC | PRN

## 2017-03-01 MED ORDER — PROPOFOL 10 MG/ML IV BOLUS
INTRAVENOUS | Status: AC
  Filled 2017-03-01: qty 20

## 2017-03-01 MED ORDER — TRAMADOL HCL 50 MG PO TABS: 50.0000 mg | ORAL_TABLET | Freq: Four times a day (QID) | ORAL | 0 refills | Status: DC | PRN

## 2017-03-01 MED ORDER — TRAMADOL HCL 50 MG PO TABS: 50.0000 mg | ORAL_TABLET | Freq: Four times a day (QID) | ORAL | Status: DC | PRN

## 2017-03-01 MED ORDER — PROPOFOL 10 MG/ML IV BOLUS
INTRAVENOUS | Status: DC | PRN
  Administered 2017-03-01: 150 mg via INTRAVENOUS

## 2017-03-01 MED ORDER — HYDROCODONE-ACETAMINOPHEN 5-325 MG PO TABS
1.0000 | ORAL_TABLET | ORAL | Status: DC | PRN
  Administered 2017-03-01: 1 via ORAL
  Administered 2017-03-01: 2 via ORAL
  Filled 2017-03-01: qty 1
  Filled 2017-03-01: qty 2

## 2017-03-01 MED ORDER — HYDROMORPHONE HCL 1 MG/ML IJ SOLN
0.2500 mg | INTRAMUSCULAR | Status: DC | PRN
Start: 2017-03-01 — End: 2017-03-01
  Administered 2017-03-01 (×2): 0.25 mg via INTRAVENOUS

## 2017-03-01 MED ORDER — SCOPOLAMINE 1 MG/3DAYS TD PT72
MEDICATED_PATCH | TRANSDERMAL | Status: DC | PRN
  Administered 2017-03-01: 1 via TRANSDERMAL

## 2017-03-01 MED ORDER — CHLORHEXIDINE GLUCONATE CLOTH 2 % EX PADS: 6.0000 | MEDICATED_PAD | Freq: Once | CUTANEOUS | Status: DC

## 2017-03-01 MED ORDER — MIDAZOLAM HCL 2 MG/2ML IJ SOLN
INTRAMUSCULAR | Status: AC
  Filled 2017-03-01: qty 2

## 2017-03-01 MED ORDER — ACETAMINOPHEN 325 MG PO TABS
650.0000 mg | ORAL_TABLET | Freq: Four times a day (QID) | ORAL | Status: DC | PRN
  Administered 2017-03-01: 650 mg via ORAL
  Filled 2017-03-01: qty 2

## 2017-03-01 MED ORDER — METOCLOPRAMIDE HCL 5 MG/ML IJ SOLN
INTRAMUSCULAR | Status: DC | PRN
  Administered 2017-03-01: 10 mg via INTRAVENOUS

## 2017-03-01 MED ORDER — PHENYLEPHRINE 40 MCG/ML (10ML) SYRINGE FOR IV PUSH (FOR BLOOD PRESSURE SUPPORT)
PREFILLED_SYRINGE | INTRAVENOUS | Status: AC
  Filled 2017-03-01: qty 10

## 2017-03-01 MED ORDER — LACTATED RINGERS IV SOLN
INTRAVENOUS | Status: DC | PRN
  Administered 2017-03-01 (×2): via INTRAVENOUS

## 2017-03-01 MED ORDER — ONDANSETRON HCL 4 MG/2ML IJ SOLN: 4.0000 mg | Freq: Once | INTRAMUSCULAR | Status: DC | PRN

## 2017-03-01 MED ORDER — HYDROMORPHONE HCL 1 MG/ML IJ SOLN
INTRAMUSCULAR | Status: AC
  Administered 2017-03-01: 0.25 mg via INTRAVENOUS
  Filled 2017-03-01: qty 1

## 2017-03-01 MED ORDER — DEXAMETHASONE SODIUM PHOSPHATE 4 MG/ML IJ SOLN
INTRAMUSCULAR | Status: DC | PRN
  Administered 2017-03-01: 8 mg via INTRAVENOUS

## 2017-03-01 MED ORDER — DEXAMETHASONE SODIUM PHOSPHATE 10 MG/ML IJ SOLN
INTRAMUSCULAR | Status: AC
  Filled 2017-03-01: qty 1

## 2017-03-01 MED ORDER — LIDOCAINE 2% (20 MG/ML) 5 ML SYRINGE
INTRAMUSCULAR | Status: DC | PRN
  Administered 2017-03-01: 80 mg via INTRAVENOUS

## 2017-03-01 MED ORDER — HYDROMORPHONE HCL 1 MG/ML IJ SOLN
1.0000 mg | INTRAMUSCULAR | Status: DC | PRN
  Administered 2017-03-01: 0.5 mg via INTRAVENOUS
  Filled 2017-03-01: qty 1

## 2017-03-01 MED ORDER — CEFAZOLIN SODIUM-DEXTROSE 2-4 GM/100ML-% IV SOLN
2.0000 g | INTRAVENOUS | Status: AC
  Administered 2017-03-01: 2 g via INTRAVENOUS

## 2017-03-01 MED ORDER — ONDANSETRON HCL 4 MG/2ML IJ SOLN: 4.0000 mg | Freq: Four times a day (QID) | INTRAMUSCULAR | Status: DC | PRN

## 2017-03-01 MED ORDER — FENTANYL CITRATE (PF) 100 MCG/2ML IJ SOLN
INTRAMUSCULAR | Status: DC | PRN
  Administered 2017-03-01 (×3): 50 ug via INTRAVENOUS

## 2017-03-01 MED ORDER — MEPERIDINE HCL 50 MG/ML IJ SOLN: 6.2500 mg | INTRAMUSCULAR | Status: DC | PRN

## 2017-03-01 MED ORDER — ONDANSETRON 4 MG PO TBDP: 4.0000 mg | ORAL_TABLET | Freq: Four times a day (QID) | ORAL | Status: DC | PRN

## 2017-03-01 MED ORDER — LORAZEPAM 1 MG PO TABS
1.0000 mg | ORAL_TABLET | Freq: Four times a day (QID) | ORAL | Status: DC | PRN
  Administered 2017-03-01: 1 mg via ORAL
  Filled 2017-03-01: qty 1

## 2017-03-01 MED ORDER — FENTANYL CITRATE (PF) 250 MCG/5ML IJ SOLN
INTRAMUSCULAR | Status: AC
  Filled 2017-03-01: qty 5

## 2017-03-01 MED ORDER — LIDOCAINE 2% (20 MG/ML) 5 ML SYRINGE
INTRAMUSCULAR | Status: AC
Start: 2017-03-01 — End: ?
  Filled 2017-03-01: qty 5

## 2017-03-01 MED ORDER — ROCURONIUM BROMIDE 50 MG/5ML IV SOSY
PREFILLED_SYRINGE | INTRAVENOUS | Status: AC
  Filled 2017-03-01: qty 5

## 2017-03-01 MED ORDER — DIPHENHYDRAMINE HCL 50 MG/ML IJ SOLN
INTRAMUSCULAR | Status: DC | PRN
  Administered 2017-03-01: 12.5 mg via INTRAVENOUS

## 2017-03-01 MED ORDER — ROCURONIUM BROMIDE 50 MG/5ML IV SOSY
PREFILLED_SYRINGE | INTRAVENOUS | Status: DC | PRN
  Administered 2017-03-01: 10 mg via INTRAVENOUS
  Administered 2017-03-01: 50 mg via INTRAVENOUS

## 2017-03-01 MED ORDER — ONDANSETRON HCL 4 MG/2ML IJ SOLN
INTRAMUSCULAR | Status: AC
  Filled 2017-03-01: qty 2

## 2017-03-01 MED ORDER — SUGAMMADEX SODIUM 200 MG/2ML IV SOLN
INTRAVENOUS | Status: DC | PRN
  Administered 2017-03-01: 200 mg via INTRAVENOUS

## 2017-03-01 MED ORDER — KCL IN DEXTROSE-NACL 20-5-0.45 MEQ/L-%-% IV SOLN
INTRAVENOUS | Status: DC
  Administered 2017-03-01 – 2017-03-02 (×2): via INTRAVENOUS
  Filled 2017-03-01 (×2): qty 1000

## 2017-03-01 SURGICAL SUPPLY — 35 items
ATTRACTOMAT 16X20 MAGNETIC DRP (DRAPES) ×2 IMPLANT
BENZOIN TINCTURE PRP APPL 2/3 (GAUZE/BANDAGES/DRESSINGS) ×2 IMPLANT
BLADE SURG 15 STRL LF DISP TIS (BLADE) ×1 IMPLANT
BLADE SURG 15 STRL SS (BLADE) ×1
CHLORAPREP W/TINT 26ML (MISCELLANEOUS) ×2 IMPLANT
CLIP VESOCCLUDE MED 6/CT (CLIP) ×4 IMPLANT
CLIP VESOCCLUDE SM WIDE 6/CT (CLIP) ×4 IMPLANT
DISSECTOR ROUND CHERRY 3/8 STR (MISCELLANEOUS) IMPLANT
DRAPE LAPAROTOMY T 98X78 PEDS (DRAPES) ×2 IMPLANT
ELECT PENCIL ROCKER SW 15FT (MISCELLANEOUS) ×2 IMPLANT
ELECT REM PT RETURN 15FT ADLT (MISCELLANEOUS) ×2 IMPLANT
GAUZE SPONGE 4X4 12PLY STRL (GAUZE/BANDAGES/DRESSINGS) ×4 IMPLANT
GAUZE SPONGE 4X4 16PLY XRAY LF (GAUZE/BANDAGES/DRESSINGS) ×2 IMPLANT
GLOVE SURG ORTHO 8.0 STRL STRW (GLOVE) ×2 IMPLANT
GOWN STRL REUS W/TWL XL LVL3 (GOWN DISPOSABLE) ×6 IMPLANT
HEMOSTAT SURGICEL 2X4 FIBR (HEMOSTASIS) IMPLANT
ILLUMINATOR WAVEGUIDE N/F (MISCELLANEOUS) ×2 IMPLANT
KIT BASIN OR (CUSTOM PROCEDURE TRAY) ×2 IMPLANT
LIGHT WAVEGUIDE WIDE FLAT (MISCELLANEOUS) IMPLANT
PACK BASIC VI WITH GOWN DISP (CUSTOM PROCEDURE TRAY) ×2 IMPLANT
POWDER SURGICEL 3.0 GRAM (HEMOSTASIS) ×2 IMPLANT
SHEARS HARMONIC 9CM CVD (BLADE) ×2 IMPLANT
STAPLER VISISTAT 35W (STAPLE) ×2 IMPLANT
STRIP CLOSURE SKIN 1/2X4 (GAUZE/BANDAGES/DRESSINGS) ×2 IMPLANT
SUT MNCRL AB 4-0 PS2 18 (SUTURE) ×2 IMPLANT
SUT SILK 2 0 (SUTURE) ×1
SUT SILK 2-0 18XBRD TIE 12 (SUTURE) ×1 IMPLANT
SUT SILK 3 0 (SUTURE)
SUT SILK 3-0 18XBRD TIE 12 (SUTURE) IMPLANT
SUT VIC AB 3-0 SH 18 (SUTURE) ×2 IMPLANT
SYR BULB IRRIGATION 50ML (SYRINGE) ×2 IMPLANT
TAPE CLOTH SURG 4X10 WHT LF (GAUZE/BANDAGES/DRESSINGS) ×2 IMPLANT
TOWEL OR 17X26 10 PK STRL BLUE (TOWEL DISPOSABLE) ×2 IMPLANT
TOWEL OR NON WOVEN STRL DISP B (DISPOSABLE) ×2 IMPLANT
YANKAUER SUCT BULB TIP 10FT TU (MISCELLANEOUS) ×2 IMPLANT

## 2017-03-01 NOTE — Anesthesia Preprocedure Evaluation (Signed)
Anesthesia Evaluation  Patient identified by MRN, date of birth, ID band Patient awake    Reviewed: Allergy & Precautions, NPO status , Patient's Chart, lab work & pertinent test results  Airway Mallampati: I  TM Distance: >3 FB Neck ROM: Full    Dental   Pulmonary former smoker,    Pulmonary exam normal        Cardiovascular hypertension, Pt. on medications Normal cardiovascular exam     Neuro/Psych  Headaches, Anxiety    GI/Hepatic   Endo/Other  Hypothyroidism   Renal/GU      Musculoskeletal   Abdominal   Peds  Hematology   Anesthesia Other Findings   Reproductive/Obstetrics                             Anesthesia Physical Anesthesia Plan  ASA: II  Anesthesia Plan: General   Post-op Pain Management:    Induction: Intravenous  PONV Risk Score and Plan: 3 and Midazolam, Dexamethasone and Ondansetron  Airway Management Planned: Oral ETT  Additional Equipment:   Intra-op Plan:   Post-operative Plan: Extubation in OR  Informed Consent: I have reviewed the patients History and Physical, chart, labs and discussed the procedure including the risks, benefits and alternatives for the proposed anesthesia with the patient or authorized representative who has indicated his/her understanding and acceptance.     Plan Discussed with: CRNA and Surgeon  Anesthesia Plan Comments:         Anesthesia Quick Evaluation

## 2017-03-01 NOTE — Interval H&P Note (Signed)
History and Physical Interval Note:  03/01/2017 7:04 AM  Leah Thompson  has presented today for surgery, with the diagnosis of RIGHT THYROID NODULE.   The various methods of treatment have been discussed with the patient and family. After consideration of risks, benefits and other options for treatment, the patient has consented to    Procedure(s): RIGHT THYROID LOBECTOMY (Right) as a surgical intervention .    The patient's history has been reviewed, patient examined, no change in status, stable for surgery.  I have reviewed the patient's chart and labs.  Questions were answered to the patient's satisfaction.    Armandina Gemma, [facility] Office: [place]

## 2017-03-01 NOTE — Op Note (Signed)
Procedure Note  Pre-operative Diagnosis:  Right thyroid nodule  Post-operative Diagnosis:  same  Surgeon:  Armandina Gemma, MD  Assistant:  none   Procedure:  Right thyroid lobectomy  Anesthesia:  General  Estimated Blood Loss:  minimal  Drains: none         Specimen: thyroid lobe to pathology  Indications:  patient is referred by Dr. Elayne Snare for enlarging thyroid nodule. Patient has previously been seen in my practice by Dr. Greer Pickerel. Patient initially found a thyroid nodule in the right thyroid lobe on self-examination in early 2017. Patient had lost weight while taking a diet pill. Nodule had become more noticeable. Patient was seen and evaluated and underwent a thyroid ultrasound showing the nodule to measure 3.7 cm. There was a tiny nodule in the left thyroid lobe. Follow-up ultrasound in 6 months demonstrated enlargement to 4.0 cm. Fine needle aspiration biopsy was performed and showed a benign follicular nodule, Bethesda category II. patient was placed on levothyroxine 50 g daily in hopes of suppressing the nodule. Follow-up ultrasound in October 2018 however shows continued enlargement with the nodule now measuring 4.8 x 2.5 x 3.0 cm. TSH level is adequately suppressed at 0.76 on her current dosage of levothyroxine. Patient has begun to notice the nodule more. She feels like it is enlarging. She does have some mild compressive symptoms. She denies any dysphagia or dyspnea. She has no true globus sensation. Patient has had no prior head or neck surgery. She presents today for surgical resection.    Procedure Details: Procedure was done in OR #1 at the [facility].  The patient was brought to the operating room and placed in a supine position on the operating room table.  Following administration of general anesthesia, the patient was positioned and then prepped and draped in the usual aseptic fashion.  After ascertaining that an adequate level of anesthesia  had been achieved, a Kocher incision was made with #15 blade.  Dissection was carried through subcutaneous tissues and platysma. Hemostasis was achieved with the electrocautery.  Skin flaps were elevated cephalad and caudad from the thyroid notch to the sternal notch.  A self-retaining retractor was placed for exposure.  Strap muscles were incised in the midline and dissection was begun on the right side.  Strap muscles were reflected laterally.  The right thyroid lobe was markedly enlarge with a dominant mass.  The lobe was gently mobilized with blunt dissection.  Superior pole vessels were dissected out and divided individually between small and medium Ligaclips with the Harmonic scalpel.  The thyroid lobe was rolled anteriorly.  Branches of the inferior thyroid artery were divided between small Ligaclips with the Harmonic scalpel.  Inferior venous tributaries were divided between Ligaclips.  Both the superior and inferior parathyroid glands were identified and preserved on their vascular pedicles.  The recurrent laryngeal nerve was identified and preserved along its course.  The ligament of Gwenlyn Found was released with the electrocautery and the gland was mobilized onto the anterior trachea. Isthmus was mobilized across the midline.  There was a small pyramidal lobe present which was resected with the thyroid isthmus.  The thyroid parenchyma was transected at the junction of the isthmus and contralateral thyroid lobe with the Harmonic scalpel.  The thyroid lobe and isthmus were submitted to pathology for review.  The neck was irrigated with warm saline.  Surgicel powder was placed throughout the operative field.  Strap muscles were reapproximated in the midline with interrupted 3-0 Vicryl sutures.  Platysma was closed with interrupted 3-0 Vicryl sutures.  Skin was closed with a running 4-0 Monocryl subcuticular suture.  Wound was washed and dried and steri-strips were applied.  Dry gauze dressing was placed.  The  patient was awakened from anesthesia and brought to the recovery room.  The patient tolerated the procedure well.   Armandina Gemma, MD [facility] Office: [phone]

## 2017-03-01 NOTE — Anesthesia Procedure Notes (Signed)
Procedure Name: Intubation Date/Time: 03/01/2017 7:30 AM Performed by: Deliah Boston, CRNA Pre-anesthesia Checklist: Patient identified, Emergency Drugs available, Suction available and Patient being monitored Patient Re-evaluated:Patient Re-evaluated prior to induction Oxygen Delivery Method: Circle system utilized Preoxygenation: Pre-oxygenation with 100% oxygen Induction Type: IV induction Ventilation: Mask ventilation without difficulty Laryngoscope Size: Mac and 3 Grade View: Grade I Tube type: Oral Tube size: 7.0 mm Number of attempts: 1 Airway Equipment and Method: Stylet and Oral airway Placement Confirmation: ETT inserted through vocal cords under direct vision,  positive ETCO2 and breath sounds checked- equal and bilateral Secured at: 22 cm Tube secured with: Tape Dental Injury: Teeth and Oropharynx as per pre-operative assessment

## 2017-03-01 NOTE — Anesthesia Postprocedure Evaluation (Signed)
Anesthesia Post Note  Patient: Leah Thompson  Procedure(s) Performed: RIGHT THYROID LOBECTOMY (Right Neck)     Patient location during evaluation: PACU Anesthesia Type: General Level of consciousness: awake and alert Pain management: pain level controlled Vital Signs Assessment: post-procedure vital signs reviewed and stable Respiratory status: spontaneous breathing, nonlabored ventilation, respiratory function stable and patient connected to nasal cannula oxygen Cardiovascular status: blood pressure returned to baseline and stable Postop Assessment: no apparent nausea or vomiting Anesthetic complications: no    Last Vitals:  Vitals:   03/01/17 1206 03/01/17 1306  BP: 101/71 98/69  Pulse: 65 (!) 57  Resp: 16 17  Temp: (!) 36.4 C 36.5 C  SpO2: 100% 98%    Last Pain:  Vitals:   03/01/17 1442  TempSrc:   PainSc: 5                  Akio Hudnall DAVID

## 2017-03-01 NOTE — Transfer of Care (Signed)
Immediate Anesthesia Transfer of Care Note  Patient: Leah Thompson  Procedure(s) Performed: Procedure(s): RIGHT THYROID LOBECTOMY (Right)  Patient Location: PACU  Anesthesia Type:General  Level of Consciousness: Patient easily awoken, sedated, comfortable, cooperative, following commands, responds to stimulation.   Airway & Oxygen Therapy: Patient spontaneously breathing, ventilating well, oxygen via simple oxygen mask.  Post-op Assessment: Report given to PACU RN, vital signs reviewed and stable, moving all extremities.   Post vital signs: Reviewed and stable.  Complications: No apparent anesthesia complications  Last Vitals:  Vitals:   03/01/17 0540  BP: (!) 103/42  Pulse: 63  Resp: 16  SpO2: 99%    Last Pain:  Vitals:   03/01/17 0540  TempSrc: Oral      Patients Stated Pain Goal: 3 (79/98/72 1587)  Complications: No apparent anesthesia complications

## 2017-03-01 NOTE — Discharge Instructions (Signed)
[facility] ° °THYROID & PARATHYROID SURGERY:  POST-OP INSTRUCTIONS ° °Always review your discharge instruction sheet from the facility where your surgery was performed. ° °A prescription for pain medication may be given to you upon discharge.  Take your pain medication as prescribed.  If narcotic pain medicine is not needed, then you may take acetaminophen (Tylenol) or ibuprofen (Advil) as needed. ° °Take your usually prescribed medications unless otherwise directed. ° °If you need a refill on your pain medication, please contact our office during regular business hours.  Prescriptions will not be processed by our office after 5 pm or on weekends. ° °Start with a light diet upon arrival home, such as soup and crackers or toast.  Be sure to drink plenty of fluids daily.  Resume your normal diet the day after surgery. ° °Most patients will experience some swelling and bruising on the chest and neck area.  Ice packs will help.  Swelling and bruising can take several days to resolve.  ° °It is common to experience some constipation after surgery.  Increasing fluid intake and taking a stool softener (Colace) will usually help or prevent this problem.  A mild laxative (Milk of Magnesia or Miralax) should be taken according to package directions if there has been no bowel movement after 48 hours. ° °You have steri-strips and a gauze dressing over your incision.  You may remove the gauze bandage on the second day after surgery, and you may shower at that time.  Leave your steri-strips (small skin tapes) in place directly over the incision.  These strips should remain on the skin for 5-7 days and then be removed.  You may get them wet in the shower and pat them dry. ° °You may resume regular (light) daily activities beginning the next day - such as daily self-care, walking, climbing stairs - gradually increasing activities as tolerated.  You may have sexual intercourse when it is comfortable.  Refrain  from any heavy lifting or straining until approved by your doctor.  You may drive when you no longer are taking prescription pain medication, you can comfortably wear a seatbelt, and you can safely maneuver your car and apply brakes. ° °You should see your doctor in the office for a follow-up appointment approximately three weeks after your surgery.  Make sure that you call for this appointment within a day or two after you arrive home to insure a convenient appointment time. ° °WHEN TO CALL YOUR DOCTOR: °-- Fever greater than 101.5 °-- Inability to urinate °-- Nausea and/or vomiting - persistent °-- Extreme swelling or bruising °-- Continued bleeding from incision °-- Increased pain, redness, or drainage from the incision °-- Difficulty swallowing or breathing °-- Muscle cramping or spasms °-- Numbness or tingling in hands or around lips ° °The clinic staff is available to answer your questions during regular business hours.  Please don’t hesitate to call and ask to speak to one of the nurses if you have concerns. ° °Kaitlyne Friedhoff M. Talayla Doyel, MD, FACS °General & Endocrine Surgery °[facility] °Office: [phone] ° °Website: [email or web address] ° ° °

## 2017-03-02 ENCOUNTER — Encounter (HOSPITAL_COMMUNITY): Payer: Self-pay | Admitting: Surgery

## 2017-03-02 DIAGNOSIS — Z87891 Personal history of nicotine dependence: Secondary | ICD-10-CM | POA: Diagnosis not present

## 2017-03-02 DIAGNOSIS — E039 Hypothyroidism, unspecified: Secondary | ICD-10-CM | POA: Diagnosis not present

## 2017-03-02 DIAGNOSIS — I10 Essential (primary) hypertension: Secondary | ICD-10-CM | POA: Diagnosis not present

## 2017-03-02 DIAGNOSIS — Z79899 Other long term (current) drug therapy: Secondary | ICD-10-CM | POA: Diagnosis not present

## 2017-03-02 DIAGNOSIS — C73 Malignant neoplasm of thyroid gland: Secondary | ICD-10-CM | POA: Diagnosis not present

## 2017-03-02 DIAGNOSIS — F419 Anxiety disorder, unspecified: Secondary | ICD-10-CM | POA: Diagnosis not present

## 2017-03-02 DIAGNOSIS — E041 Nontoxic single thyroid nodule: Secondary | ICD-10-CM | POA: Diagnosis not present

## 2017-03-02 NOTE — Progress Notes (Signed)
Order received from Dr. Harlow Asa to increased current iv fluid from 50 ml/hr to 75 ml/hr.  Will continue to monitor patient.

## 2017-03-02 NOTE — Discharge Summary (Signed)
Physician Discharge Summary  Patient ID: Leah Thompson MRN: [id number] DOB/AGE: 39-Jan-1977 71 y.o.  Admit date: 03/01/2017 Discharge date: 03/02/2017  Admission Diagnoses:  Right thyroid nodule  Discharge Diagnoses: same Principal Problem:   Thyroid nodule Active Problems:   Right thyroid nodule   Discharged Condition: good  Hospital Course: Right thyroid lobectomy  Consults: None  Significant Diagnostic Studies: none  Treatments: surgery: right thyroid lobectomy  Discharge Exam: Blood pressure 97/67, pulse 62, temperature 98.5 F (36.9 C), temperature source Oral, resp. rate 16, height 5\' 5"  (1.651 m), weight 67.6 kg (149 lb), last menstrual period 02/17/2017, SpO2 98 %. General appearance: alert, cooperative and no distress Neck: Incision clean and dry; minimal swelling; no hematoma Abd - soft; mild cramping - feels like she needs to have BM  Disposition: 01-Home or Self Care  Discharge Instructions    Call MD for:  persistant nausea and vomiting   Complete by:  As directed    Call MD for:  redness, tenderness, or signs of infection (pain, swelling, redness, odor or green/yellow discharge around incision site)   Complete by:  As directed    Call MD for:  severe uncontrolled pain   Complete by:  As directed    Call MD for:  temperature >100.4   Complete by:  As directed    Diet general   Complete by:  As directed    Driving Restrictions   Complete by:  As directed    Do not drive while taking pain medications   Increase activity slowly   Complete by:  As directed    May shower / Bathe   Complete by:  As directed      Allergies as of 03/02/2017   No Known Allergies     Medication List    TAKE these medications   LORazepam 1 MG tablet Commonly known as:  ATIVAN Take 1 tablet (1 mg total) by mouth every 8 (eight) hours. What changed:  when to take this   traMADol 50 MG tablet Commonly known as:  ULTRAM Take 1-2 tablets (50-100 mg total)  every 6 (six) hours as needed by mouth for moderate pain.      Follow-up Information    Armandina Gemma, MD. Schedule an appointment as soon as possible for a visit in 3 week(s).   Specialty:  General Surgery Why:  For wound re-check Contact information: [address] [place] [address] [phone]           Signed: Maia Petties 03/02/2017, 7:32 AM

## 2017-03-02 NOTE — Care Management Note (Signed)
Case Management Note  Patient Details  Name: Leah Thompson MRN: [id number] Date of Birth: [birth year]  Subjective/Objective:     rightt thyroid lobectomy                  Action/Plan: Discharge Planning: Chart reviewed. No NCM needs identified.   Expected Discharge Date:  03/02/17               Expected Discharge Plan:  Home/Self Care  In-House Referral:  NA  Discharge planning Services  CM Consult  Post Acute Care Choice:  NA Choice offered to:  NA  DME Arranged:  N/A DME Agency:  NA  HH Arranged:  NA HH Agency:  NA  Status of Service:  Completed, signed off  If discussed at [place] of Stay Meetings, dates discussed:    Additional Comments:  Erenest Rasher, RN 03/02/2017, 9:42 AM

## 2017-03-02 NOTE — Progress Notes (Signed)
Patient BP low (88/47) waiting for call back from Dr. Harlow Asa.

## 2017-03-11 ENCOUNTER — Telehealth: Payer: Self-pay | Admitting: Endocrinology

## 2017-03-11 NOTE — Telephone Encounter (Signed)
Patient stated that she just had surgery 03/01/17, she has been off her medication 3 weeks, and need to come have her levels checked. Please advise

## 2017-03-11 NOTE — Telephone Encounter (Signed)
Patient is scheduled for lab 03/21/2017 and visit on 03/25/2017. Do you want her to come in earlier or keep these dates?  Please advise.

## 2017-03-11 NOTE — Telephone Encounter (Signed)
These dates are okay, no urgency on her labs

## 2017-03-11 NOTE — Telephone Encounter (Signed)
Please advise 

## 2017-03-11 NOTE — Telephone Encounter (Signed)
We can schedule her for office visit with labs couple of days before

## 2017-03-12 ENCOUNTER — Telehealth: Payer: Self-pay | Admitting: Endocrinology

## 2017-03-12 ENCOUNTER — Encounter: Payer: Self-pay | Admitting: Surgery

## 2017-03-12 DIAGNOSIS — C73 Malignant neoplasm of thyroid gland: Secondary | ICD-10-CM | POA: Insufficient documentation

## 2017-03-12 NOTE — Telephone Encounter (Signed)
Pt called states sx 03/01/17 removed right sided nodules from thryoid which came back as cancer. Pt states she has nodules on the left side as well. Please advise pt how to fu for the left sided nocules.

## 2017-03-12 NOTE — Telephone Encounter (Signed)
Please see message.  I have sent a message to [place] to schedule for this week.  Please advise.

## 2017-03-12 NOTE — Telephone Encounter (Signed)
Called patient and she stated that she is off this week and needs to come in this week.  Please advise if okay to schedule ASAP?

## 2017-03-12 NOTE — Telephone Encounter (Signed)
Please schedule patient for labs and visit this week if possible. She will not be able to come in for her scheduled appointment when she goes back to work and she is off this week due to her surgery. Thank you!

## 2017-03-12 NOTE — Telephone Encounter (Signed)
Will discuss with patient when she comes in

## 2017-03-12 NOTE — Telephone Encounter (Signed)
Pt has an appt now this week she is wanting to be sure that Dr. Dwyane Dee speaks with Dr. Harlow Asa before she is seen on Friday.

## 2017-03-12 NOTE — Telephone Encounter (Signed)
She will have to be scheduled for a follow-up appointment also as soon as possible

## 2017-03-13 ENCOUNTER — Other Ambulatory Visit (INDEPENDENT_AMBULATORY_CARE_PROVIDER_SITE_OTHER): Payer: 59

## 2017-03-13 DIAGNOSIS — E041 Nontoxic single thyroid nodule: Secondary | ICD-10-CM | POA: Diagnosis not present

## 2017-03-13 LAB — T4, FREE: FREE T4: 0.74 ng/dL (ref 0.60–1.60)

## 2017-03-13 LAB — TSH: TSH: 2.43 u[IU]/mL (ref 0.35–4.50)

## 2017-03-13 NOTE — Telephone Encounter (Signed)
Called patient and let her know that this will be discussed when she comes in this week.

## 2017-03-14 NOTE — Progress Notes (Signed)
Patient ID: Leah Thompson, female   DOB: 1975-08-06, 41 y.o.   MRN: [id number]           Reason for Appointment: Follow-up of thyroid nodule  Referring physician: Dr. Greer Pickerel    History of Present Illness:   The patient's thyroid nodule was first discovered by herself in 5/17 when she felt her neck     The thyroid ultrasound showed the following: Dominant solid minimally hypoechoic nodule with some internal cystic degeneration in the right mid gland measures 3.7 x 1.9 x 2.2 cm.  A smaller hypoechoic solid nodule in the upper gland measures 1.0 x 0.6 x 0.8 cm.  Needle aspiration of this nodule showed benign follicular adenoma.  She has been on a therapeutic trial of levothyroxine 50 g daily since 6/17 to see if it will help reduce the size of the nodule She thinks she does not feel the nodule is as  Although she thought that the thyroid nodule is not as prominent since starting the thyroid suppression she did have significant increase in her nodule size on her follow-up in 12/2016 For this reason she was sent for another ultrasound which showed the nodule to be 4.8 cm in size compared to 4 cm before She also has a 0.6 cm nodule on the left side  Patient had right thyroid lobectomy done on 03/01/17  Pathology shows 4 cm follicular variant of papillary carcinoma with another focus of 0.6 cm in the right side No lymph nodes were involved and the tumor was encapsulated with invasion into but not through the capsule  Patient has come in today for further discussion on future therapy and consideration of her thyroid functions and supplement which she has not taken for some time  Labs:  Lab Results  Component Value Date   TSH 2.43 03/13/2017   TSH 0.76 06/11/2016   TSH 0.38 03/29/2016   FREET4 0.74 03/13/2017   FREET4 0.79 06/11/2016   FREET4 0.98 03/29/2016     Allergies as of 03/15/2017   No Known Allergies     Medication List        Accurate as of 03/15/17   9:55 AM. Always use your most recent med list.          HAIR/SKIN/NAILS PO Take 1 capsule by mouth daily.   LORazepam 1 MG tablet Commonly known as:  ATIVAN Take 1 tablet (1 mg total) by mouth every 8 (eight) hours.       Allergies: No Known Allergies  Past Medical History:  Diagnosis Date  . Anxiety   . Headache    migraines  . HELLP (hemolytic anemia/elev liver enzymes/low platelets in pregnancy)   . HELLP (hemolytic anemia/elev liver enzymes/low platelets in pregnancy)   . Hypothyroidism    nodule  . Preeclampsia      Past Surgical History:  Procedure Laterality Date  . BREAST SURGERY     augmentation  . CESAREAN SECTION  02/05/14   HELLP syndrome  . THYROID LOBECTOMY Right 03/01/2017   Procedure: RIGHT THYROID LOBECTOMY;  Surgeon: Armandina Gemma, MD;  Location: [facility];  Service: General;  Laterality: Right;  . TONSILLECTOMY  2005    Family History  Problem Relation Age of Onset  . Heart disease Father     Social History:  reports that she has quit smoking. she has never used smokeless tobacco. She reports that she does not drink alcohol or use drugs.   Review of Systems:    Examination:  BP 110/78   Pulse 82   Ht 5\' 5"  (1.651 m)   Wt 147 lb 9.6 oz (67 kg)   LMP 02/17/2017   SpO2 98%   BMI 24.56 kg/m          Mild swelling around the incision area of the neck, no redness   Assessment/Plan:  Thyroid cancer, follicular variant of papillary carcinoma She has a 4 cm and a 0.6 cm tumor on the right Although ultrasound indicates multiple lateral nodules the ultrasound report did not describe the nodules on the left  Given her multifocal tumor and the size of the tumor she should have completion thyroidectomy Discussed that she may potentially have a tumor focus on the left side which cannot be easily diagnosed by ultrasound; she does have a 0.6 cm nodule on the left without any suspicious characteristics Also will be able to although her more  easily with thyroglobulin levels and ultrasounds if she has a total thyroidectomy Also reassured her that having total thyroidectomy will not make her gain weight which she is afraid of  Currently the patient is feeling overwhelmed with the diagnosis of tumor and having to recover from surgery and is not keen on going for another surgery at this time All patient questions were answered Counseling time on subjects discussed in assessment and plan sections is over 50% of today's 25 minute visit  Since the patient has a focus of lymphocytic thyroiditis and TSH is relatively higher than before will have her go back to levothyroxine 50 g daily; this may also be useful for thyroid suppression before she has repeat surgery Will have follow-up labs on the next visit and name for TSH of about 1 or less   [facility] 03/15/2017

## 2017-03-15 ENCOUNTER — Ambulatory Visit (INDEPENDENT_AMBULATORY_CARE_PROVIDER_SITE_OTHER): Payer: 59 | Admitting: Endocrinology

## 2017-03-15 ENCOUNTER — Encounter: Payer: Self-pay | Admitting: Endocrinology

## 2017-03-15 VITALS — BP 110/78 | HR 82 | Ht 65.0 in | Wt 147.6 lb

## 2017-03-15 DIAGNOSIS — C73 Malignant neoplasm of thyroid gland: Secondary | ICD-10-CM | POA: Diagnosis not present

## 2017-03-15 MED ORDER — LEVOTHYROXINE SODIUM 50 MCG PO TABS: 50.0000 ug | ORAL_TABLET | Freq: Every day | ORAL | 3 refills | Status: DC

## 2017-03-15 NOTE — Telephone Encounter (Signed)
Patient stated that she will get the surgery, send referral to Armandina Gemma as soon as possible

## 2017-03-15 NOTE — Telephone Encounter (Signed)
Called patient and let her know the notes were sent over. She stated that her appointment with Dr. Harlow Asa is on Monday.

## 2017-03-15 NOTE — Telephone Encounter (Signed)
Please see message. °

## 2017-03-15 NOTE — Telephone Encounter (Signed)
She has a follow-up appointment with him next week I believe, I have also sent him my office notes from today and a staff message regarding surgery and do not need another consultation

## 2017-03-18 ENCOUNTER — Ambulatory Visit: Payer: Self-pay | Admitting: Surgery

## 2017-03-19 ENCOUNTER — Encounter (HOSPITAL_COMMUNITY): Payer: Self-pay | Admitting: *Deleted

## 2017-03-19 ENCOUNTER — Other Ambulatory Visit: Payer: Self-pay

## 2017-03-19 NOTE — Progress Notes (Signed)
  Spoke with DR Seward Speck in anesthesia.regarding history and CXR done 02/28/17.  No new CXR is needed for 03/25/17 surgery per Dr Glennon Mac in anesthesia.

## 2017-03-21 ENCOUNTER — Other Ambulatory Visit: Payer: Commercial Managed Care - HMO

## 2017-03-25 ENCOUNTER — Observation Stay (HOSPITAL_COMMUNITY)
Admission: RE | Admit: 2017-03-25 | Discharge: 2017-03-26 | Disposition: A | Discharge status: Home / Self Care | Payer: 59 | Source: Ambulatory Visit | Attending: Surgery | Admitting: Surgery

## 2017-03-25 ENCOUNTER — Ambulatory Visit (HOSPITAL_COMMUNITY): Payer: 59 | Admitting: Anesthesiology

## 2017-03-25 ENCOUNTER — Other Ambulatory Visit: Payer: Self-pay

## 2017-03-25 ENCOUNTER — Encounter (HOSPITAL_COMMUNITY)
Admission: RE | Disposition: A | Discharge status: Home / Self Care | Payer: Self-pay | Source: Ambulatory Visit | Attending: Surgery

## 2017-03-25 ENCOUNTER — Ambulatory Visit: Payer: Commercial Managed Care - HMO | Admitting: Endocrinology

## 2017-03-25 ENCOUNTER — Encounter (HOSPITAL_COMMUNITY): Payer: Self-pay

## 2017-03-25 DIAGNOSIS — Z87891 Personal history of nicotine dependence: Secondary | ICD-10-CM | POA: Diagnosis not present

## 2017-03-25 DIAGNOSIS — Z79899 Other long term (current) drug therapy: Secondary | ICD-10-CM | POA: Insufficient documentation

## 2017-03-25 DIAGNOSIS — F419 Anxiety disorder, unspecified: Secondary | ICD-10-CM | POA: Insufficient documentation

## 2017-03-25 DIAGNOSIS — I10 Essential (primary) hypertension: Secondary | ICD-10-CM | POA: Insufficient documentation

## 2017-03-25 DIAGNOSIS — E039 Hypothyroidism, unspecified: Secondary | ICD-10-CM | POA: Diagnosis not present

## 2017-03-25 DIAGNOSIS — E041 Nontoxic single thyroid nodule: Secondary | ICD-10-CM | POA: Diagnosis not present

## 2017-03-25 DIAGNOSIS — C73 Malignant neoplasm of thyroid gland: Principal | ICD-10-CM | POA: Diagnosis present

## 2017-03-25 HISTORY — PX: THYROIDECTOMY: SHX17

## 2017-03-25 LAB — CBC
HEMATOCRIT: 37 % (ref 36.0–46.0)
HEMOGLOBIN: 12.4 g/dL (ref 12.0–15.0)
MCH: 31.1 pg (ref 26.0–34.0)
MCHC: 33.5 g/dL (ref 30.0–36.0)
MCV: 92.7 fL (ref 78.0–100.0)
PLATELETS: 256 10*3/uL (ref 150–400)
RBC: 3.99 MIL/uL (ref 3.87–5.11)
RDW: 12.3 % (ref 11.5–15.5)
WBC: 4.6 10*3/uL (ref 4.0–10.5)

## 2017-03-25 LAB — HCG, SERUM, QUALITATIVE: Preg, Serum: NEGATIVE

## 2017-03-25 SURGERY — THYROIDECTOMY
Anesthesia: General | Site: Neck

## 2017-03-25 MED ORDER — OXYCODONE HCL 5 MG PO TABS: 5.0000 mg | ORAL_TABLET | Freq: Once | ORAL | Status: DC | PRN

## 2017-03-25 MED ORDER — ROCURONIUM BROMIDE 50 MG/5ML IV SOSY
PREFILLED_SYRINGE | INTRAVENOUS | Status: AC
  Filled 2017-03-25: qty 5

## 2017-03-25 MED ORDER — MIDAZOLAM HCL 2 MG/2ML IJ SOLN
INTRAMUSCULAR | Status: DC | PRN
  Administered 2017-03-25: 2 mg via INTRAVENOUS

## 2017-03-25 MED ORDER — PROPOFOL 10 MG/ML IV BOLUS
INTRAVENOUS | Status: DC | PRN
  Administered 2017-03-25: 140 mg via INTRAVENOUS

## 2017-03-25 MED ORDER — ONDANSETRON HCL 4 MG/2ML IJ SOLN
4.0000 mg | Freq: Four times a day (QID) | INTRAMUSCULAR | Status: DC | PRN
  Administered 2017-03-25: 4 mg via INTRAVENOUS
  Filled 2017-03-25: qty 2

## 2017-03-25 MED ORDER — HYDROMORPHONE HCL 1 MG/ML IJ SOLN: 1.0000 mg | INTRAMUSCULAR | Status: DC | PRN

## 2017-03-25 MED ORDER — ACETAMINOPHEN 650 MG RE SUPP: 650.0000 mg | Freq: Four times a day (QID) | RECTAL | Status: DC | PRN

## 2017-03-25 MED ORDER — LIDOCAINE 2% (20 MG/ML) 5 ML SYRINGE
INTRAMUSCULAR | Status: DC | PRN
  Administered 2017-03-25: 60 mg via INTRAVENOUS

## 2017-03-25 MED ORDER — TRAMADOL HCL 50 MG PO TABS: 50.0000 mg | ORAL_TABLET | Freq: Four times a day (QID) | ORAL | Status: DC | PRN

## 2017-03-25 MED ORDER — SUGAMMADEX SODIUM 200 MG/2ML IV SOLN
INTRAVENOUS | Status: DC | PRN
  Administered 2017-03-25: 150 mg via INTRAVENOUS

## 2017-03-25 MED ORDER — LACTATED RINGERS IV SOLN
INTRAVENOUS | Status: DC
  Administered 2017-03-25 (×2): via INTRAVENOUS

## 2017-03-25 MED ORDER — 0.9 % SODIUM CHLORIDE (POUR BTL) OPTIME
TOPICAL | Status: DC | PRN
  Administered 2017-03-25: 1000 mL

## 2017-03-25 MED ORDER — REWETTING DROPS SOLN: 1.0000 [drp] | Freq: Every day | Status: DC | PRN

## 2017-03-25 MED ORDER — FENTANYL CITRATE (PF) 100 MCG/2ML IJ SOLN
INTRAMUSCULAR | Status: AC
  Filled 2017-03-25: qty 2

## 2017-03-25 MED ORDER — ONDANSETRON 4 MG PO TBDP: 4.0000 mg | ORAL_TABLET | Freq: Four times a day (QID) | ORAL | Status: DC | PRN

## 2017-03-25 MED ORDER — MEPERIDINE HCL 50 MG/ML IJ SOLN
6.2500 mg | INTRAMUSCULAR | Status: DC | PRN
Start: 2017-03-25 — End: 2017-03-25

## 2017-03-25 MED ORDER — PROPOFOL 10 MG/ML IV BOLUS
INTRAVENOUS | Status: AC
  Filled 2017-03-25: qty 20

## 2017-03-25 MED ORDER — ONDANSETRON HCL 4 MG/2ML IJ SOLN
INTRAMUSCULAR | Status: DC | PRN
  Administered 2017-03-25: 4 mg via INTRAVENOUS

## 2017-03-25 MED ORDER — CHLORHEXIDINE GLUCONATE CLOTH 2 % EX PADS
6.0000 | MEDICATED_PAD | Freq: Once | CUTANEOUS | Status: AC
  Administered 2017-03-24: 6 via TOPICAL

## 2017-03-25 MED ORDER — OXYCODONE HCL 5 MG/5ML PO SOLN
5.0000 mg | Freq: Once | ORAL | Status: DC | PRN
  Filled 2017-03-25: qty 5

## 2017-03-25 MED ORDER — PROMETHAZINE HCL 25 MG/ML IJ SOLN: 6.2500 mg | INTRAMUSCULAR | Status: DC | PRN

## 2017-03-25 MED ORDER — LORAZEPAM 1 MG PO TABS
1.0000 mg | ORAL_TABLET | Freq: Every evening | ORAL | Status: DC | PRN
  Administered 2017-03-25: 1 mg via ORAL
  Filled 2017-03-25: qty 1

## 2017-03-25 MED ORDER — DEXAMETHASONE SODIUM PHOSPHATE 10 MG/ML IJ SOLN
INTRAMUSCULAR | Status: DC | PRN
  Administered 2017-03-25: 10 mg via INTRAVENOUS

## 2017-03-25 MED ORDER — ACETAMINOPHEN 325 MG PO TABS: 650.0000 mg | ORAL_TABLET | Freq: Four times a day (QID) | ORAL | Status: DC | PRN

## 2017-03-25 MED ORDER — HYDROMORPHONE HCL 1 MG/ML IJ SOLN
INTRAMUSCULAR | Status: AC
  Filled 2017-03-25: qty 1

## 2017-03-25 MED ORDER — LIDOCAINE 2% (20 MG/ML) 5 ML SYRINGE
INTRAMUSCULAR | Status: AC
  Filled 2017-03-25: qty 5

## 2017-03-25 MED ORDER — MIDAZOLAM HCL 2 MG/2ML IJ SOLN
1.0000 mg | INTRAMUSCULAR | Status: DC
  Administered 2017-03-25: 2 mg via INTRAVENOUS

## 2017-03-25 MED ORDER — EPHEDRINE SULFATE-NACL 50-0.9 MG/10ML-% IV SOSY
PREFILLED_SYRINGE | INTRAVENOUS | Status: DC | PRN
  Administered 2017-03-25 (×2): 10 mg via INTRAVENOUS

## 2017-03-25 MED ORDER — MIDAZOLAM HCL 2 MG/2ML IJ SOLN
INTRAMUSCULAR | Status: AC
  Administered 2017-03-25: 2 mg via INTRAVENOUS
  Filled 2017-03-25: qty 2

## 2017-03-25 MED ORDER — CEFAZOLIN SODIUM-DEXTROSE 2-4 GM/100ML-% IV SOLN
2.0000 g | INTRAVENOUS | Status: AC
  Administered 2017-03-25: 2 g via INTRAVENOUS
  Filled 2017-03-25: qty 100

## 2017-03-25 MED ORDER — POLYVINYL ALCOHOL 1.4 % OP SOLN: 1.0000 [drp] | Freq: Every day | OPHTHALMIC | Status: DC | PRN

## 2017-03-25 MED ORDER — FENTANYL CITRATE (PF) 250 MCG/5ML IJ SOLN
INTRAMUSCULAR | Status: DC | PRN
  Administered 2017-03-25: 50 ug via INTRAVENOUS
  Administered 2017-03-25: 100 ug via INTRAVENOUS
  Administered 2017-03-25: 50 ug via INTRAVENOUS

## 2017-03-25 MED ORDER — ROCURONIUM BROMIDE 10 MG/ML (PF) SYRINGE
PREFILLED_SYRINGE | INTRAVENOUS | Status: DC | PRN
  Administered 2017-03-25: 50 mg via INTRAVENOUS

## 2017-03-25 MED ORDER — HYDROMORPHONE HCL 1 MG/ML IJ SOLN
0.2500 mg | INTRAMUSCULAR | Status: DC | PRN
  Administered 2017-03-25 (×2): 0.5 mg via INTRAVENOUS

## 2017-03-25 MED ORDER — CHLORHEXIDINE GLUCONATE CLOTH 2 % EX PADS
6.0000 | MEDICATED_PAD | Freq: Once | CUTANEOUS | Status: AC
  Administered 2017-03-25: 6 via TOPICAL

## 2017-03-25 MED ORDER — KCL IN DEXTROSE-NACL 20-5-0.45 MEQ/L-%-% IV SOLN
INTRAVENOUS | Status: DC
  Administered 2017-03-25: 18:00:00 via INTRAVENOUS
  Filled 2017-03-25 (×2): qty 1000

## 2017-03-25 MED ORDER — MIDAZOLAM HCL 2 MG/2ML IJ SOLN
INTRAMUSCULAR | Status: AC
  Filled 2017-03-25: qty 2

## 2017-03-25 MED ORDER — HYDROCODONE-ACETAMINOPHEN 5-325 MG PO TABS
1.0000 | ORAL_TABLET | ORAL | Status: DC | PRN
  Administered 2017-03-25 – 2017-03-26 (×2): 1 via ORAL
  Filled 2017-03-25: qty 2
  Filled 2017-03-25: qty 1

## 2017-03-25 SURGICAL SUPPLY — 37 items
ATTRACTOMAT 16X20 MAGNETIC DRP (DRAPES) ×2 IMPLANT
BLADE SURG 15 STRL LF DISP TIS (BLADE) ×1 IMPLANT
BLADE SURG 15 STRL SS (BLADE) ×1
CHLORAPREP W/TINT 26ML (MISCELLANEOUS) ×2 IMPLANT
CLIP VESOCCLUDE MED 6/CT (CLIP) ×4 IMPLANT
CLIP VESOCCLUDE SM WIDE 6/CT (CLIP) ×6 IMPLANT
COVER SURGICAL LIGHT HANDLE (MISCELLANEOUS) ×2 IMPLANT
DISSECTOR ROUND CHERRY 3/8 STR (MISCELLANEOUS) IMPLANT
DRAPE LAPAROTOMY T 98X78 PEDS (DRAPES) ×2 IMPLANT
DRAPE SHEET LG 3/4 BI-LAMINATE (DRAPES) ×2 IMPLANT
DRAPE UTILITY XL STRL (DRAPES) ×2 IMPLANT
ELECT PENCIL ROCKER SW 15FT (MISCELLANEOUS) ×2 IMPLANT
ELECT REM PT RETURN 15FT ADLT (MISCELLANEOUS) ×2 IMPLANT
GAUZE SPONGE 4X4 12PLY STRL (GAUZE/BANDAGES/DRESSINGS) ×2 IMPLANT
GAUZE SPONGE 4X4 16PLY XRAY LF (GAUZE/BANDAGES/DRESSINGS) ×2 IMPLANT
GLOVE SURG ORTHO 8.0 STRL STRW (GLOVE) ×2 IMPLANT
GOWN STRL REUS W/TWL XL LVL3 (GOWN DISPOSABLE) ×4 IMPLANT
HEMOSTAT SURGICEL 2X4 FIBR (HEMOSTASIS) IMPLANT
ILLUMINATOR WAVEGUIDE N/F (MISCELLANEOUS) ×2 IMPLANT
KIT BASIN OR (CUSTOM PROCEDURE TRAY) ×2 IMPLANT
LIGHT WAVEGUIDE WIDE FLAT (MISCELLANEOUS) IMPLANT
PACK BASIC VI WITH GOWN DISP (CUSTOM PROCEDURE TRAY) ×2 IMPLANT
POWDER SURGICEL 3.0 GRAM (HEMOSTASIS) IMPLANT
SHEARS HARMONIC 9CM CVD (BLADE) ×2 IMPLANT
STAPLER VISISTAT 35W (STAPLE) IMPLANT
STRIP CLOSURE SKIN 1/2X4 (GAUZE/BANDAGES/DRESSINGS) ×2 IMPLANT
SUT MNCRL AB 4-0 PS2 18 (SUTURE) ×2 IMPLANT
SUT SILK 2 0 (SUTURE)
SUT SILK 2-0 18XBRD TIE 12 (SUTURE) IMPLANT
SUT SILK 3 0 (SUTURE)
SUT SILK 3-0 18XBRD TIE 12 (SUTURE) IMPLANT
SUT VIC AB 3-0 SH 18 (SUTURE) ×4 IMPLANT
SYR BULB IRRIGATION 50ML (SYRINGE) ×2 IMPLANT
TAPE CLOTH SURG 4X10 WHT LF (GAUZE/BANDAGES/DRESSINGS) ×2 IMPLANT
TOWEL OR 17X26 10 PK STRL BLUE (TOWEL DISPOSABLE) ×2 IMPLANT
TOWEL OR NON WOVEN STRL DISP B (DISPOSABLE) ×2 IMPLANT
YANKAUER SUCT BULB TIP 10FT TU (MISCELLANEOUS) ×4 IMPLANT

## 2017-03-25 NOTE — Anesthesia Postprocedure Evaluation (Signed)
Anesthesia Post Note  Patient: Leah Thompson  Procedure(s) Performed: COMPLETION THYROIDECTOMY (N/A Neck)     Patient location during evaluation: PACU Anesthesia Type: General Level of consciousness: awake and alert Pain management: pain level controlled Vital Signs Assessment: post-procedure vital signs reviewed and stable Respiratory status: spontaneous breathing, nonlabored ventilation and respiratory function stable Cardiovascular status: blood pressure returned to baseline and stable Postop Assessment: no apparent nausea or vomiting Anesthetic complications: no    Last Vitals:  Vitals:   03/25/17 1720 03/25/17 1834  BP: (!) 144/59 (!) 113/49  Pulse: (!) 57 (!) 57  Resp: 20 20  Temp: (!) 36.4 C   SpO2: 100% 100%    Last Pain:  Vitals:   03/25/17 1720  TempSrc:   PainSc: [place]

## 2017-03-25 NOTE — Progress Notes (Signed)
Pt stating she is very anxious and tired of waiting and doesn't understand why she is still waiting.  Pt original surgery time was 1245, upon further investigation found out pt's surgery delayed about 1 hour.  Pt and her husband informed of this, apoligized for delay.  Informed them that due to the current weather situation (large snow event) that several surgeries were canceled and/or moved around due to this.  Pt asked for something for her nerves, so I called Dr. Royetta Car and informed him of pt's concerns.  MD states he does not want to give any sedating drugs until he talks with her.  Pt was informed of this.  Again apologized to pt.  Pt is on her phone and not wanting to talk.

## 2017-03-25 NOTE — Plan of Care (Signed)
df

## 2017-03-25 NOTE — Anesthesia Preprocedure Evaluation (Signed)
Anesthesia Evaluation  Patient identified by MRN, date of birth, ID band Patient awake    Reviewed: Allergy & Precautions, NPO status , Patient's Chart, lab work & pertinent test results  Airway Mallampati: I  TM Distance: >3 FB Neck ROM: Full    Dental no notable dental hx.    Pulmonary former smoker,    Pulmonary exam normal breath sounds clear to auscultation       Cardiovascular hypertension, Pt. on medications Normal cardiovascular exam Rhythm:Regular Rate:Normal     Neuro/Psych  Headaches, Anxiety    GI/Hepatic   Endo/Other  Hypothyroidism   Renal/GU      Musculoskeletal   Abdominal   Peds  Hematology   Anesthesia Other Findings   Reproductive/Obstetrics                             Anesthesia Physical  Anesthesia Plan  ASA: II  Anesthesia Plan: General   Post-op Pain Management:    Induction: Intravenous  PONV Risk Score and Plan: 3 and Midazolam, Dexamethasone and Ondansetron  Airway Management Planned: Oral ETT  Additional Equipment:   Intra-op Plan:   Post-operative Plan: Extubation in OR  Informed Consent: I have reviewed the patients History and Physical, chart, labs and discussed the procedure including the risks, benefits and alternatives for the proposed anesthesia with the patient or authorized representative who has indicated his/her understanding and acceptance.     Plan Discussed with: CRNA and Surgeon  Anesthesia Plan Comments:         Anesthesia Quick Evaluation

## 2017-03-25 NOTE — Anesthesia Procedure Notes (Signed)
Procedure Name: Intubation Performed by: Gean Maidens, CRNA Pre-anesthesia Checklist: Patient identified, Emergency Drugs available, Suction available, Patient being monitored and Timeout performed Patient Re-evaluated:Patient Re-evaluated prior to induction Oxygen Delivery Method: Circle system utilized Preoxygenation: Pre-oxygenation with 100% oxygen Induction Type: IV induction Ventilation: Mask ventilation without difficulty Laryngoscope Size: Mac and 4 Grade View: Grade I Tube type: Oral Tube size: 7.0 mm Number of attempts: 1 Airway Equipment and Method: Stylet Placement Confirmation: ETT inserted through vocal cords under direct vision,  positive ETCO2,  CO2 detector and breath sounds checked- equal and bilateral Secured at: 21 cm Tube secured with: Tape Dental Injury: Teeth and Oropharynx as per pre-operative assessment

## 2017-03-25 NOTE — Transfer of Care (Signed)
Immediate Anesthesia Transfer of Care Note  Patient: Leah Thompson  Procedure(s) Performed: COMPLETION THYROIDECTOMY (N/A Neck)  Patient Location: PACU  Anesthesia Type:General  Level of Consciousness: awake, alert  and oriented  Airway & Oxygen Therapy: Patient Spontanous Breathing and Patient connected to face mask oxygen  Post-op Assessment: Report given to RN and Post -op Vital signs reviewed and stable  Post vital signs: Reviewed and stable  Last Vitals:  Vitals:   03/25/17 1350 03/25/17 1355  BP:    Pulse: (!) 57 66  Resp:    Temp:    SpO2: 100% 100%    Last Pain:  Vitals:   03/25/17 1015  TempSrc: Oral         Complications: No apparent anesthesia complications

## 2017-03-25 NOTE — Op Note (Signed)
Procedure Note  Pre-operative Diagnosis:  Papillary thyroid carcinoma  Post-operative Diagnosis:  same  Surgeon:  Armandina Gemma, MD  Assistant:  none   Procedure:  Completion thyroidectomy  Anesthesia:  General  Estimated Blood Loss:  minimal  Drains: none         Specimen: remaining left thyroid lobe to pathology  Indications:  Patient is a 41 yo WF who underwent right thyroid lobectomy on 03/01/2017 for dominant nodule.  Path shows papillary thyroid carcinoma.  Now for completion thyroidectomy in anticipation of RAI.  Procedure Details: Procedure was done in OR #4 at the [facility].  The patient was brought to the operating room and placed in a supine position on the operating room table.  Following administration of general anesthesia, the patient was positioned and then prepped and draped in the usual aseptic fashion.  After ascertaining that an adequate level of anesthesia had been achieved, a Kocher incision was made through the previous incision with #15 blade.  Dissection was carried through subcutaneous tissues and platysma and suture material.  A small seroma was evacuated between layers. Hemostasis was achieved with the electrocautery.  Skin flaps were elevated cephalad and caudad from the thyroid notch to the sternal notch.  A self-retaining retractor was placed for exposure.  Strap muscles were incised in the midline and suture material removed and dissection was begun on the left side.  Strap muscles were reflected laterally.  The left thyroid lobe was essentially normal in size without any palpable nodules.  The lobe was gently mobilized with blunt dissection.  Superior pole vessels were dissected out and divided individually between small and medium Ligaclips with the Harmonic scalpel.  The thyroid lobe was rolled anteriorly.  Branches of the inferior thyroid artery were divided between small Ligaclips with the Harmonic scalpel.  Inferior venous tributaries were divided  between Ligaclips.  Both the superior and inferior parathyroid glands were identified and preserved on their vascular pedicles.  The recurrent laryngeal nerve was identified and preserved along its course.  The ligament of Gwenlyn Found was released with the electrocautery and the gland was mobilized onto the anterior trachea. Isthmus was mobilized to the midline where the previous margin was encountered.  The tissue was completely excised and submitted to pathology for review.  Palpation revealed no other abnormalities or enlarged lymph nodes.  The neck was irrigated with warm saline.  Fibular was placed throughout the operative field.  Strap muscles were reapproximated in the midline with interrupted 3-0 Vicryl sutures.  Platysma was closed with interrupted 3-0 Vicryl sutures.  Skin was closed with a running 4-0 Monocryl subcuticular suture.  Wound was washed and dried and steri-strips were applied.  Dry gauze dressing was placed.  The patient was awakened from anesthesia and brought to the recovery room.  The patient tolerated the procedure well.   Armandina Gemma, MD [facility] Office: [phone]

## 2017-03-25 NOTE — Interval H&P Note (Signed)
History and Physical Interval Note:  03/25/2017 1:42 PM  Leah Thompson  has presented today for surgery, with the diagnosis of PAPILLARY THYROID CARCINOMA.  The various methods of treatment have been discussed with the patient and family. After consideration of risks, benefits and other options for treatment, the patient has consented to    Procedure(s): COMPLETION THYROIDECTOMY (N/A) as a surgical intervention .    The patient's history has been reviewed, patient examined, no change in status, stable for surgery.  I have reviewed the patient's chart and labs.  Questions were answered to the patient's satisfaction.    Armandina Gemma, [place] [facility] Office: [place]

## 2017-03-26 ENCOUNTER — Encounter (HOSPITAL_COMMUNITY): Payer: Self-pay | Admitting: Surgery

## 2017-03-26 DIAGNOSIS — F419 Anxiety disorder, unspecified: Secondary | ICD-10-CM | POA: Diagnosis not present

## 2017-03-26 DIAGNOSIS — E039 Hypothyroidism, unspecified: Secondary | ICD-10-CM | POA: Diagnosis not present

## 2017-03-26 DIAGNOSIS — C73 Malignant neoplasm of thyroid gland: Secondary | ICD-10-CM | POA: Diagnosis not present

## 2017-03-26 DIAGNOSIS — Z87891 Personal history of nicotine dependence: Secondary | ICD-10-CM | POA: Diagnosis not present

## 2017-03-26 DIAGNOSIS — I10 Essential (primary) hypertension: Secondary | ICD-10-CM | POA: Diagnosis not present

## 2017-03-26 DIAGNOSIS — Z79899 Other long term (current) drug therapy: Secondary | ICD-10-CM | POA: Diagnosis not present

## 2017-03-26 LAB — BASIC METABOLIC PANEL
ANION GAP: 5 (ref 5–15)
BUN: 10 mg/dL (ref 6–20)
CALCIUM: 8.4 mg/dL — AB (ref 8.9–10.3)
CHLORIDE: 108 mmol/L (ref 101–111)
CO2: 25 mmol/L (ref 22–32)
Creatinine, Ser: 0.49 mg/dL (ref 0.44–1.00)
GFR calc Af Amer: >60 mL/min (ref >60)
GFR calc non Af Amer: >60 mL/min (ref >60)
GLUCOSE: 139 mg/dL — AB (ref 65–99)
POTASSIUM: 4.3 mmol/L (ref 3.5–5.1)
Sodium: 138 mmol/L (ref 135–145)

## 2017-03-26 MED ORDER — CALCIUM CARBONATE ANTACID 500 MG PO CHEW
2.0000 | CHEWABLE_TABLET | ORAL | Status: AC
  Administered 2017-03-26: 400 mg via ORAL
  Filled 2017-03-26: qty 2

## 2017-03-26 MED ORDER — LEVOTHYROXINE SODIUM 100 MCG PO TABS: 100.0000 ug | ORAL_TABLET | Freq: Every day | ORAL | 3 refills | Status: DC

## 2017-03-26 MED ORDER — BISACODYL 10 MG RE SUPP
10.0000 mg | Freq: Once | RECTAL | Status: DC
  Filled 2017-03-26: qty 1

## 2017-03-26 MED ORDER — SYNTHROID 100 MCG PO TABS: 100.0000 ug | ORAL_TABLET | Freq: Every day | ORAL | 3 refills | Status: DC

## 2017-03-26 MED ORDER — HYDROCODONE-ACETAMINOPHEN 5-325 MG PO TABS: 1.0000 | ORAL_TABLET | ORAL | 0 refills | Status: DC | PRN

## 2017-03-26 NOTE — Progress Notes (Signed)
Patient request something to have a BM Dr Harlow Asa paged with orders received  D Mateo Flow RN

## 2017-03-26 NOTE — Discharge Summary (Signed)
Physician Discharge Summary [facility]  Patient ID: Leah Thompson MRN: [id number] DOB/AGE: March 10, 1976 41 y.o.  Admit date: 03/25/2017 Discharge date: 03/26/2017  Admission Diagnoses:  Papillary thyroid carcinoma  Discharge Diagnoses:  Active Problems:   Papillary thyroid carcinoma [facility])   Discharged Condition: good  Hospital Course: Patient was admitted for observation following thyroid surgery.  Post op course was uncomplicated.  Pain was well controlled.  Tolerated diet.  Post op calcium level on morning following surgery was 8.4 mg/dl.  Patient was prepared for discharge home on POD#1.  Consults: None  Treatments: surgery: completion thyroidectomy  Discharge Exam: Blood pressure 99/66, pulse (!) 58, temperature 97.8 F (36.6 C), temperature source Oral, resp. rate 16, height 5\' 5"  (1.651 m), weight 66.7 kg (147 lb), last menstrual period 03/20/2017, SpO2 98 %. HEENT - clear Neck - wound dry and intact; minimal STS; voice normal Chest - clear bilaterally Cor - RRR  Disposition: Home   Allergies as of 03/26/2017   No Known Allergies     Medication List    TAKE these medications   HAIR/SKIN/NAILS PO Take 1 capsule by mouth 4 (four) times a week.   HYDROcodone-acetaminophen 5-325 MG tablet Commonly known as:  NORCO/VICODIN Take 1-2 tablets by mouth every 4 (four) hours as needed for moderate pain.   levothyroxine 100 MCG tablet Commonly known as:  SYNTHROID, LEVOTHROID Take 1 tablet (100 mcg total) by mouth daily before breakfast. What changed:    medication strength  how much to take  when to take this   LORazepam 1 MG tablet Commonly known as:  ATIVAN Take 1 tablet (1 mg total) by mouth every 8 (eight) hours. What changed:    when to take this  reasons to take this   REWETTING DROPS Soln Place 1-2 drops into both eyes daily as needed (for contact lenses dryness.).      Follow-up Information    Armandina Gemma, MD.  Schedule an appointment as soon as possible for a visit in 3 week(s).   Specialty:  General Surgery Contact information: [address] [place] [address] [phone]        Elayne Snare, MD. Schedule an appointment as soon as possible for a visit in 3 week(s).   Specialty:  Endocrinology Contact information: [address] [place] [address] [phone]           [name], MD, [facility] Office: [phone]   Signed: [name] 03/26/2017, 8:42 AM

## 2017-03-28 NOTE — Progress Notes (Signed)
Please contact patient and notify of benign pathology results.  Aiyannah Fayad M. Lynita Groseclose, MD, FACS [facility] Office: [phone]   

## 2017-04-05 ENCOUNTER — Other Ambulatory Visit: Payer: Self-pay | Admitting: *Deleted

## 2017-04-05 ENCOUNTER — Telehealth: Payer: Self-pay | Admitting: Obstetrics and Gynecology

## 2017-04-05 MED ORDER — LORAZEPAM 1 MG PO TABS: 1.0000 mg | ORAL_TABLET | Freq: Every evening | ORAL | 1 refills | Status: DC | PRN

## 2017-04-05 NOTE — Telephone Encounter (Signed)
The patient called and stated that she is out of refills on her LORazepam (ATIVAN) 1 MG tablet, The patient stated that she needs a refill and/or call from Amy/ to confirm. Please advise.

## 2017-04-05 NOTE — Telephone Encounter (Signed)
Done-ac 

## 2017-04-23 ENCOUNTER — Other Ambulatory Visit: Payer: 59

## 2017-04-26 ENCOUNTER — Other Ambulatory Visit: Payer: Self-pay | Admitting: Endocrinology

## 2017-04-26 ENCOUNTER — Other Ambulatory Visit (INDEPENDENT_AMBULATORY_CARE_PROVIDER_SITE_OTHER): Payer: 59

## 2017-04-26 DIAGNOSIS — R5383 Other fatigue: Secondary | ICD-10-CM

## 2017-04-26 DIAGNOSIS — C73 Malignant neoplasm of thyroid gland: Secondary | ICD-10-CM | POA: Diagnosis not present

## 2017-04-26 LAB — CBC
HCT: 39.2 % (ref 36.0–46.0)
HEMOGLOBIN: 13 g/dL (ref 12.0–15.0)
MCHC: 33.2 g/dL (ref 30.0–36.0)
MCV: 94.7 fl (ref 78.0–100.0)
PLATELETS: 267 10*3/uL (ref 150.0–400.0)
RBC: 4.14 Mil/uL (ref 3.87–5.11)
RDW: 12.7 % (ref 11.5–15.5)
WBC: 6.3 10*3/uL (ref 4.0–10.5)

## 2017-04-26 LAB — TSH: TSH: 4.22 u[IU]/mL (ref 0.35–4.50)

## 2017-04-28 ENCOUNTER — Encounter: Payer: Self-pay | Admitting: Endocrinology

## 2017-04-28 NOTE — Progress Notes (Signed)
Patient ID: Leah Thompson, female   DOB: 1975-07-06, 42 y.o.   MRN: [id number]           Reason for Appointment: Follow-up of thyroid cancer     History of Present Illness:   The patient's thyroid nodule was first discovered by herself in 5/17 when she felt her neck     The thyroid ultrasound showed the following: Dominant solid minimally hypoechoic nodule with some internal cystic degeneration in the right mid gland measures 3.7 x 1.9 x 2.2 cm.  A smaller hypoechoic solid nodule in the upper gland measures 1.0 x 0.6 x 0.8 cm. Needle aspiration of this nodule showed benign follicular adenoma. She has been on a therapeutic trial of levothyroxine 50 g daily since 6/17 to see if it will help reduce the size of the nodule  Although she thought that the thyroid nodule is not as prominent since starting the thyroid suppression she did have significant increase in her nodule size on her follow-up in 12/2016 For this reason she was sent for another ultrasound which showed the nodule to be 4.8 cm in size compared to 4 cm before  RECENT history: Patient had right thyroid lobectomy done on 03/01/17  Pathology shows 4 cm follicular variant of papillary carcinoma with another focus of 0.6 cm in the right side No lymph nodes were involved and the tumor was encapsulated with invasion into but not through the capsule  She had completion thyroidectomy on 03/25/2017  After surgery her levothyroxine was increased up to 100 g daily  However she is complaining of increasing fatigue which may not be consistent from day to day She also is having some issues with the sleeping well and anxiety No cold intolerance, she  tends to feel more She is consistent with taking the levothyroxine before breakfast  Her TSH is upper normal at 4.2  Although she was found to have a low normal calcium of 8.4 after surgery she has no muscle cramps or tingling, taking only a multivitamin currently  She is also  concerned about her weight gain  Labs:  Lab Results  Component Value Date   TSH 4.22 04/26/2017   TSH 2.43 03/13/2017   TSH 0.76 06/11/2016   FREET4 0.74 03/13/2017   FREET4 0.79 06/11/2016   FREET4 0.98 03/29/2016    Wt Readings from Last 3 Encounters:  04/29/17 153 lb (69.4 kg)  03/25/17 147 lb (66.7 kg)  03/15/17 147 lb 9.6 oz (67 kg)    Lab Results  Component Value Date   CALCIUM 8.4 (L) 03/26/2017     Allergies as of 04/29/2017   No Known Allergies     Medication List        Accurate as of 04/29/17  4:15 PM. Always use your most recent med list.          HAIR/SKIN/NAILS PO Take 1 capsule by mouth 4 (four) times a week.   HYDROcodone-acetaminophen 5-325 MG tablet Commonly known as:  NORCO/VICODIN Take 1-2 tablets by mouth every 4 (four) hours as needed for moderate pain.   levothyroxine 100 MCG tablet Commonly known as:  SYNTHROID, LEVOTHROID Take 1 tablet (100 mcg total) by mouth daily before breakfast.   SYNTHROID 100 MCG tablet Generic drug:  levothyroxine Take 1 tablet (100 mcg total) by mouth daily.   LORazepam 1 MG tablet Commonly known as:  ATIVAN Take 1 tablet (1 mg total) by mouth at bedtime as needed (for sleep.).   REWETTING DROPS Soln Place  1-2 drops into both eyes daily as needed (for contact lenses dryness.).       Allergies: No Known Allergies  Past Medical History:  Diagnosis Date  . Anxiety   . Headache    migraines  . HELLP (hemolytic anemia/elev liver enzymes/low platelets in pregnancy)   . HELLP (hemolytic anemia/elev liver enzymes/low platelets in pregnancy)   . Hypothyroidism    nodule  . Preeclampsia      Past Surgical History:  Procedure Laterality Date  . BREAST SURGERY     augmentation  . CESAREAN SECTION  02/05/14   HELLP syndrome  . THYROID LOBECTOMY Right 03/01/2017   Procedure: RIGHT THYROID LOBECTOMY;  Surgeon: Armandina Gemma, MD;  Location: [facility];  Service: General;  Laterality: Right;  .  THYROIDECTOMY N/A 03/25/2017   Procedure: COMPLETION THYROIDECTOMY;  Surgeon: Armandina Gemma, MD;  Location: [facility]S;  Service: General;  Laterality: N/A;  . TONSILLECTOMY  2005    Family History  Problem Relation Age of Onset  . Heart disease Father     Social History:  reports that she has quit smoking. she has never used smokeless tobacco. She reports that she does not drink alcohol or use drugs.   Review of Systems:  She was concerned about her being anemic and hemoglobin was checked, this is normal  Anxiety: She takes lorazepam as needed from her PCP   Examination:   BP (!) 108/58   Pulse (!) 58   Ht 5\' 5"  (1.651 m)   Wt 153 lb (69.4 kg)   SpO2 99%   BMI 25.46 kg/m            Assessment/Plan:  Thyroid cancer, follicular variant of papillary carcinoma She has had a 4 cm and a 0.6 cm tumor on the right No tumor found in the contralateral lobe Have recommended remnant ablation with i-131 today but she is hesitant to do it right now, also concerned about the fact that she has a 78-year-old daughter at home  HYPOTHYROIDISM: She has postsurgical hypothyroidism, currently taking 100 g of levothyroxine as above since her surgery about a month ago She is complaining of significant per day but her TSH is only 4.2 Most likely her fatigue is related to intermittent difficulties with sleep and anxiety  She will increase her dose to 112 g daily and follow-up in 2 months  Low-normal postoperative calcium: She is asymptomatic but discussed needing to take at least 500 mg of calcium supplementation until her next visit   Elayne Snare 04/29/2017

## 2017-04-29 ENCOUNTER — Encounter: Payer: Self-pay | Admitting: Endocrinology

## 2017-04-29 ENCOUNTER — Telehealth: Payer: Self-pay | Admitting: Obstetrics and Gynecology

## 2017-04-29 ENCOUNTER — Ambulatory Visit (INDEPENDENT_AMBULATORY_CARE_PROVIDER_SITE_OTHER): Payer: 59 | Admitting: Endocrinology

## 2017-04-29 VITALS — BP 108/58 | HR 58 | Ht 65.0 in | Wt 153.0 lb

## 2017-04-29 DIAGNOSIS — C73 Malignant neoplasm of thyroid gland: Secondary | ICD-10-CM | POA: Diagnosis not present

## 2017-04-29 DIAGNOSIS — E890 Postprocedural hypothyroidism: Secondary | ICD-10-CM | POA: Insufficient documentation

## 2017-04-29 MED ORDER — LEVOTHYROXINE SODIUM 112 MCG PO TABS: 112.0000 ug | ORAL_TABLET | Freq: Every day | ORAL | 3 refills | Status: DC

## 2017-04-29 NOTE — Telephone Encounter (Signed)
The patient called and stated that she would like to have Amy call her in regards to having issues after her having her fibroids removed. The patient also thinks she may have a yeast infection. No other information was disclosed. Pleas advise.

## 2017-04-29 NOTE — Telephone Encounter (Signed)
The patient lvm wanting to speak with Amy in regards to scheduling an appointment with Melody

## 2017-04-30 NOTE — Telephone Encounter (Signed)
Spoke with pt she has appt with Deneise Lever 05/08/17

## 2017-05-08 ENCOUNTER — Ambulatory Visit (INDEPENDENT_AMBULATORY_CARE_PROVIDER_SITE_OTHER): Payer: 59 | Admitting: Certified Nurse Midwife

## 2017-05-08 ENCOUNTER — Encounter: Payer: Self-pay | Admitting: Certified Nurse Midwife

## 2017-05-08 VITALS — BP 113/78 | HR 71 | Ht 65.0 in | Wt 151.6 lb

## 2017-05-08 DIAGNOSIS — R4586 Emotional lability: Secondary | ICD-10-CM | POA: Diagnosis not present

## 2017-05-08 DIAGNOSIS — E039 Hypothyroidism, unspecified: Secondary | ICD-10-CM

## 2017-05-08 DIAGNOSIS — N926 Irregular menstruation, unspecified: Secondary | ICD-10-CM | POA: Diagnosis not present

## 2017-05-08 MED ORDER — SERTRALINE HCL 25 MG PO TABS: 25.0000 mg | ORAL_TABLET | Freq: Every day | ORAL | 0 refills | Status: DC

## 2017-05-08 NOTE — Progress Notes (Signed)
GYN ENCOUNTER NOTE  Subjective:       Leah Thompson is a 42 y.o. G69P0011 female is here for gynecologic evaluation of the following issues:  1. Hormonal mood changes.  She state that she has had  thyroid lobectomy and since then her anxiety has increased and mood swings increase with her cycle. She is currently taking levothyroxine and has had to have the dose adjusted. He endocrinologist has been following her levels. She is concerned that her thyroid levels are affecting her cycle and her mood.  Her cycle is becoming irregular ( a few days early or late).  She is requesting lab testing ( calcium stating is was low in the past/ hormones") and medication for her anxiety. She has taken Sarafem in the past.   Gynecologic History Patient's last menstrual period was 04/15/2017 (exact date). Contraception: none Last Pap: 05/10/16. Results were: normal Last mammogram: n/a. }  Obstetric History OB History  Gravida Para Term Preterm AB Living  2       1 1   SAB TAB Ectopic Multiple Live Births  1       1    # Outcome Date GA Lbr Len/2nd Weight Sex Delivery Anes PTL Lv  2 Gravida 02/05/14    F CS-Unspec   LIV  1 SAB               Past Medical History:  Diagnosis Date  . Anxiety   . Headache    migraines  . HELLP (hemolytic anemia/elev liver enzymes/low platelets in pregnancy)   . HELLP (hemolytic anemia/elev liver enzymes/low platelets in pregnancy)   . Hypothyroidism    nodule  . Preeclampsia     Past Surgical History:  Procedure Laterality Date  . BREAST SURGERY     augmentation  . CESAREAN SECTION  02/05/14   HELLP syndrome  . THYROID LOBECTOMY Right 03/01/2017   Procedure: RIGHT THYROID LOBECTOMY;  Surgeon: Armandina Gemma, MD;  Location: [facility];  Service: General;  Laterality: Right;  . THYROIDECTOMY N/A 03/25/2017   Procedure: COMPLETION THYROIDECTOMY;  Surgeon: Armandina Gemma, MD;  Location: [facility];  Service: General;  Laterality: N/A;  . TONSILLECTOMY  2005     Current Outpatient Medications on File Prior to Visit  Medication Sig Dispense Refill  . Biotin w/ Vitamins C & E (HAIR/SKIN/NAILS PO) Take 1 capsule by mouth 4 (four) times a week.     . levothyroxine (SYNTHROID, LEVOTHROID) 112 MCG tablet Take 1 tablet (112 mcg total) by mouth daily. 30 tablet 3  . LORazepam (ATIVAN) 1 MG tablet Take 1 tablet (1 mg total) by mouth at bedtime as needed (for sleep.). 30 tablet 1  . Soft Lens Products (REWETTING DROPS) SOLN Place 1-2 drops into both eyes daily as needed (for contact lenses dryness.).     No current facility-administered medications on file prior to visit.     No Known Allergies  Social History   Socioeconomic History  . Marital status: Married    Spouse name: Not on file  . Number of children: Not on file  . Years of education: Not on file  . Highest education level: Not on file  Social Needs  . Financial resource strain: Not on file  . Food insecurity - worry: Not on file  . Food insecurity - inability: Not on file  . Transportation needs - medical: Not on file  . Transportation needs - non-medical: Not on file  Occupational History  . Not on file  Tobacco Use  . Smoking status: Former Research scientist (life sciences)  . Smokeless tobacco: Never Used  . Tobacco comment: hx of socially  Substance and Sexual Activity  . Alcohol use: No    Alcohol/week: 0.0 oz    Comment: social  . Drug use: No  . Sexual activity: Yes    Birth control/protection: Condom  Other Topics Concern  . Not on file  Social History Narrative  . Not on file    Family History  Problem Relation Age of Onset  . Heart disease Father     The following portions of the patient's history were reviewed and updated as appropriate: allergies, current medications, past family history, past medical history, past social history, past surgical history and problem list.  Review of Systems Review of Systems - Negative except as mentioned in HPI Review of Systems - General ROS:  negative for - chills, fatigue, fever, hot flashes, malaise or night sweats Hematological and Lymphatic ROS: negative for - bleeding problems or swollen lymph nodes Gastrointestinal ROS: negative for - abdominal pain, blood in stools, change in bowel habits and nausea/vomiting Musculoskeletal ROS: negative for - joint pain, muscle pain or muscular weakness Genito-Urinary ROS: negative for - change in menstrual cycle, dysmenorrhea, dyspareunia, dysuria, genital discharge, genital ulcers, hematuria, incontinence, irregular/heavy menses, nocturia or pelvic pain Positive for mood swings that increase with her period  Behavioral: feeling more anxious  Objective:   BP 113/78   Pulse 71   Ht 5\' 5"  (1.651 m)   Wt 151 lb 9 oz (68.7 kg)   LMP 04/15/2017 (Exact Date)   BMI 25.22 kg/m  CONSTITUTIONAL: Well-developed, well-nourished female in no acute distress.  HENT:  Normocephalic, atraumatic.  NECK: Normal range of motion  SKIN: Skin is warm and dry. No rash noted. Not diaphoretic. No erythema. No pallor. [place]: Alert and oriented to person, place, and time.  PSYCHIATRIC: anxious ,  Normal judgment and thought content. CARDIOVASCULAR:Not Examined RESPIRATORY: Not Examined BREASTS: Not Examined ABDOMEN: not indicated PELVIC:not indicated MUSCULOSKELETAL: Normal range of motion. No tenderness.  No cyanosis, clubbing, or edema.  Assessment:   1. Mood changes Increasing with cycle anxiety  2. Irregular menstrual cycle - FSH/LH  3. Hypothyroidism, unspecified type - TSH - Calcium    Plan:   Discussed anxiety and mood changes with menstrual cycle. Reviewed use of medication and counseling to help treat anxiety. Reviewed use of SSRI/sarafem/zoloft etc. She would like to try Zoloft. Reviewed benefits, risks, and side effects . Red flag symptoms reviewed. Discussed regulation of TSH level, once levothyroxine dose is stabilized, may solve the changes she has noticed in her cycle. She  verbalizes understanding. Will follow up with lab results. Return in 4 wks for medication check.   Philip Aspen, CNM   I attest more than 50% of visit spent reviewing HPI, reviewing history, discussing TSH and role on menstrual cycle. Discussing anxiety and mood changes with menstrual cycle. We also reviewed medication management and counseling to treat anxiety. Total face to face time 15 min.

## 2017-05-08 NOTE — Progress Notes (Signed)
Pt has history of thyroid cancer. Would like to be put on a low dose of medicine to help with mood around period. She would like to have hormone level tested.

## 2017-05-08 NOTE — Patient Instructions (Signed)

## 2017-05-09 ENCOUNTER — Encounter: Payer: Self-pay | Admitting: Certified Nurse Midwife

## 2017-05-09 LAB — FSH/LH
FSH: 2.9 m[IU]/mL
LH: 6.8 m[IU]/mL

## 2017-05-09 LAB — CALCIUM: CALCIUM: 9.4 mg/dL (ref 8.7–10.2)

## 2017-05-09 LAB — TSH: TSH: 5.19 u[IU]/mL — ABNORMAL HIGH (ref 0.450–4.500)

## 2017-05-10 ENCOUNTER — Other Ambulatory Visit: Payer: 59

## 2017-05-13 ENCOUNTER — Telehealth: Payer: Self-pay | Admitting: *Deleted

## 2017-05-13 DIAGNOSIS — E890 Postprocedural hypothyroidism: Secondary | ICD-10-CM

## 2017-05-13 NOTE — Telephone Encounter (Signed)
Pt would like referral to Dr. Elayne Snare

## 2017-05-15 ENCOUNTER — Ambulatory Visit: Payer: 59 | Admitting: Endocrinology

## 2017-05-16 ENCOUNTER — Encounter: Payer: BLUE CROSS/BLUE SHIELD | Admitting: Obstetrics and Gynecology

## 2017-05-31 ENCOUNTER — Telehealth: Payer: Self-pay | Admitting: Obstetrics and Gynecology

## 2017-05-31 NOTE — Telephone Encounter (Signed)
The patient called and stated that she would like to speak with Amy in regards to her medication changing while she was in the hospital. No other information was disclosed. Please advise.

## 2017-06-12 ENCOUNTER — Other Ambulatory Visit: Payer: Self-pay | Admitting: Certified Nurse Midwife

## 2017-06-12 ENCOUNTER — Other Ambulatory Visit: Payer: Self-pay | Admitting: Obstetrics and Gynecology

## 2017-06-13 NOTE — Telephone Encounter (Signed)
Done-ac 

## 2017-06-18 ENCOUNTER — Telehealth: Payer: Self-pay | Admitting: Endocrinology

## 2017-06-18 NOTE — Telephone Encounter (Signed)
Unsure if this is a question you answer or the [facility])

## 2017-06-18 NOTE — Telephone Encounter (Signed)
Patient needs to take radiation pill, when? for how long?, how long does patient have to be in seclusion once taking it?. Does patient need to start now or can she wait-needs to know so that she can coordinate with work and family-has a 40 year old and a 54 year year old. Please call patient and advise at ph# [phone] or  [phone]

## 2017-06-18 NOTE — Telephone Encounter (Signed)
Radiation capsule is a single dose treatment.  This can be done anytime she is ready for it.  She will need to be avoiding close contact with child for about 3 days.  She can let us know when she is ready to do it

## 2017-06-19 NOTE — Telephone Encounter (Signed)
Pt informed of below.  

## 2017-06-19 NOTE — Telephone Encounter (Signed)
Pt informed- she wants to know the cut off date or time frame she has left. She states you mentioned doing this before the end of March and she is trying to coordinate care for her children. Please advise.

## 2017-06-19 NOTE — Telephone Encounter (Signed)
Patient returned phone call. Please give her another call back thanks.

## 2017-06-19 NOTE — Telephone Encounter (Signed)
There is no cut off, we can be flexible but preferably by end of summer

## 2017-06-19 NOTE — Telephone Encounter (Signed)
Left mess for patient to call back.  

## 2017-06-20 NOTE — Progress Notes (Deleted)
Patient ID: Leah Thompson, female   DOB: Jan 25, 1976, 42 y.o.   MRN: [id number]           Reason for Appointment: Follow-up of thyroid cancer     History of Present Illness:   The patient's thyroid nodule was first discovered by herself in 5/17 when she felt her neck     The thyroid ultrasound showed the following: Dominant solid minimally hypoechoic nodule with some internal cystic degeneration in the right mid gland measures 3.7 x 1.9 x 2.2 cm.  A smaller hypoechoic solid nodule in the upper gland measures 1.0 x 0.6 x 0.8 cm. Needle aspiration of this nodule showed benign follicular adenoma. She has been on a therapeutic trial of levothyroxine 50 g daily since 6/17 to see if it will help reduce the size of the nodule  Although she thought that the thyroid nodule is not as prominent since starting the thyroid suppression she did have significant increase in her nodule size on her follow-up in 12/2016 For this reason she was sent for another ultrasound which showed the nodule to be 4.8 cm in size compared to 4 cm before  RECENT history: Patient had right thyroid lobectomy done on 03/01/17  Pathology shows 4 cm follicular variant of papillary carcinoma with another focus of 0.6 cm in the right side No lymph nodes were involved and the tumor was encapsulated with invasion into but not through the capsule  She had completion thyroidectomy on 03/25/2017  After surgery her levothyroxine was increased up to 100 g daily  However she is complaining of increasing fatigue which may not be consistent from day to day She also is having some issues with the sleeping well and anxiety No cold intolerance, she  tends to feel more She is consistent with taking the levothyroxine before breakfast  Her TSH is upper normal at 4.2  Although she was found to have a low normal calcium of 8.4 after surgery she has no muscle cramps or tingling, taking only a multivitamin currently  She is also  concerned about her weight gain  Labs:  Lab Results  Component Value Date   TSH 5.190 (H) 05/08/2017   TSH 4.22 04/26/2017   TSH 2.43 03/13/2017   FREET4 0.74 03/13/2017   FREET4 0.79 06/11/2016   FREET4 0.98 03/29/2016    Wt Readings from Last 3 Encounters:  05/08/17 151 lb 9 oz (68.7 kg)  04/29/17 153 lb (69.4 kg)  03/25/17 147 lb (66.7 kg)    Lab Results  Component Value Date   CALCIUM 9.4 05/08/2017     Allergies as of 06/21/2017   No Known Allergies     Medication List        Accurate as of 06/20/17  9:01 PM. Always use your most recent med list.          HAIR/SKIN/NAILS PO Take 1 capsule by mouth 4 (four) times a week.   levothyroxine 112 MCG tablet Commonly known as:  SYNTHROID, LEVOTHROID Take 1 tablet (112 mcg total) by mouth daily.   LORazepam 1 MG tablet Commonly known as:  ATIVAN Take 1 tablet (1 mg total) by mouth at bedtime as needed (for sleep.).   REWETTING DROPS Soln Place 1-2 drops into both eyes daily as needed (for contact lenses dryness.).   sertraline 25 MG tablet Commonly known as:  ZOLOFT Take 1 tablet (25 mg total) by mouth daily.       Allergies: No Known Allergies  Past Medical History:  Diagnosis Date  . Anxiety   . Headache    migraines  . HELLP (hemolytic anemia/elev liver enzymes/low platelets in pregnancy)   . HELLP (hemolytic anemia/elev liver enzymes/low platelets in pregnancy)   . Hypothyroidism    nodule  . Preeclampsia      Past Surgical History:  Procedure Laterality Date  . BREAST SURGERY     augmentation  . CESAREAN SECTION  02/05/14   HELLP syndrome  . THYROID LOBECTOMY Right 03/01/2017   Procedure: RIGHT THYROID LOBECTOMY;  Surgeon: Armandina Gemma, MD;  Location: [facility];  Service: General;  Laterality: Right;  . THYROIDECTOMY N/A 03/25/2017   Procedure: COMPLETION THYROIDECTOMY;  Surgeon: Armandina Gemma, MD;  Location: [facility];  Service: General;  Laterality: N/A;  . TONSILLECTOMY  2005    Family  History  Problem Relation Age of Onset  . Heart disease Father     Social History:  reports that she has quit smoking. she has never used smokeless tobacco. She reports that she does not drink alcohol or use drugs.   Review of Systems:  She was concerned about her being anemic and hemoglobin was checked, this is normal  Anxiety: She takes lorazepam as needed from her PCP   Examination:   There were no vitals taken for this visit.           Assessment/Plan:  Thyroid cancer, follicular variant of papillary carcinoma She has had a 4 cm and a 0.6 cm tumor on the right No tumor found in the contralateral lobe Have recommended remnant ablation with i-131 today but she is hesitant to do it right now, also concerned about the fact that she has a 66-year-old daughter at home  HYPOTHYROIDISM: She has postsurgical hypothyroidism, currently taking 100 g of levothyroxine as above since her surgery about a month ago She is complaining of significant per day but her TSH is only 4.2 Most likely her fatigue is related to intermittent difficulties with sleep and anxiety  She will increase her dose to 112 g daily and follow-up in 2 months  Low-normal postoperative calcium: She is asymptomatic but discussed needing to take at least 500 mg of calcium supplementation until her next visit   Elayne Snare 06/20/2017

## 2017-06-21 ENCOUNTER — Ambulatory Visit: Payer: 59 | Admitting: Endocrinology

## 2017-06-21 DIAGNOSIS — Z0289 Encounter for other administrative examinations: Secondary | ICD-10-CM

## 2017-07-10 ENCOUNTER — Ambulatory Visit (INDEPENDENT_AMBULATORY_CARE_PROVIDER_SITE_OTHER): Payer: 59 | Admitting: Obstetrics and Gynecology

## 2017-07-10 ENCOUNTER — Other Ambulatory Visit: Payer: Self-pay | Admitting: Obstetrics and Gynecology

## 2017-07-10 ENCOUNTER — Encounter: Payer: Self-pay | Admitting: Obstetrics and Gynecology

## 2017-07-10 VITALS — BP 107/69 | HR 79 | Ht 65.0 in | Wt 154.1 lb

## 2017-07-10 DIAGNOSIS — Z01419 Encounter for gynecological examination (general) (routine) without abnormal findings: Secondary | ICD-10-CM | POA: Diagnosis not present

## 2017-07-10 DIAGNOSIS — E663 Overweight: Secondary | ICD-10-CM

## 2017-07-10 DIAGNOSIS — E039 Hypothyroidism, unspecified: Secondary | ICD-10-CM

## 2017-07-10 MED ORDER — CYANOCOBALAMIN 1000 MCG/ML IJ SOLN: 1000.0000 ug | INTRAMUSCULAR | 1 refills | Status: DC

## 2017-07-10 NOTE — Progress Notes (Signed)
Subjective:  Leah Thompson is a 41 y.o. G2P0011 at Unknown being seen today for weight loss management- initial visit.  Patient reports General ROS: positive for  - weight gain and reports previous weight loss attempts:wih adipex have been successful  Pertinent medical history includes:thyroid carcinoma   The patient has a surgical history of: thyroidectomy,   Past treatment has included: small frequent feedings, nutritional supplement, vitamin supplement,  vitamin B-12 injections, appetite ssuppresant  The following portions of the patient's history were reviewed and updated as appropriate: allergies, current medications, past family history, past medical history, past social history, past surgical history and problem list.   Objective:   Vitals:   07/10/17 0946  BP: 107/69  Pulse: 79  Weight: 154 lb 1.6 oz (69.9 kg)  Height: _0  (1.651 m)    General:  Alert, oriented and cooperative. Patient is in no acute distress.  :   :   :   :   :   :   PE: Well groomed female in no current distress,   Mental Status: Normal mood and affect. Normal behavior. Normal judgment and thought content.   Current BMI: Body mass index is 25.64 kg/m. HRR Lungs clear bilateral Breasts: breasts appear normal, no suspicious masses, no skin or nipple changes or axillary nodes, bilateral implants, no palpable abnormalities otherwise. Abdomen soft and not tender Pelvic exam: normal external genitalia, vulva, vagina, cervix, uterus and adnexa.pap obtained  Assessment and Plan:  Well woman exam - labs obtained will follow up accordingly hypothyroidism due to thyroidectomy   Overweight.  Plan: low carb, High protein diet RX for adipex 37.5 mg daily and B12 1051mg.ml monthly, to start now with first injection given at today's visit. Reviewed side-effects common to both medications and expected outcomes. Increase daily water intake to at least 8 bottle a day, every day.  Goal is to reduse  weight by 10% by end of three months, and will re-evaluate then.  RTC in 4 weeks for Nurse visit to check weight & BP, and get next B12 injections.    Please refer to After Visit Summary for other counseling recommendations.    [place] Melody N, CNM   Melody NBatavia CNM      Consider the Low Glycemic Index Diet and 6 smaller meals daily .  This boosts your metabolism and regulates your sugars:   Use the protein bar by [employer]kins because they have lots of fiber in them  Find the low carb flatbreads, tortillas and pita breads for sandwiches:  Joseph's makes a pita bread and a flat bread , available at [facility]and [employer]; TTop-of-the-Worldmakes a low carb flatbread available at [employer]and HT that is 9 net carbs and 100 cal Mission makes a low carb whole wheat tortilla available at [employer]most grocery stores with 6 net carbs and 210 cal  GMayotteyogurt can still have a lot of carbs .  Dannon Light N fit has 80 cal and 8 carbs

## 2017-07-11 LAB — COMPREHENSIVE METABOLIC PANEL
ALK PHOS: 53 IU/L (ref 39–117)
ALT: 11 IU/L (ref 0–32)
AST: 30 IU/L (ref 0–40)
Albumin/Globulin Ratio: 2.4 — ABNORMAL HIGH (ref 1.2–2.2)
Albumin: 5 g/dL (ref 3.5–5.5)
BILIRUBIN TOTAL: 0.4 mg/dL (ref 0.0–1.2)
BUN/Creatinine Ratio: 19 (ref 9–23)
BUN: 14 mg/dL (ref 6–20)
CHLORIDE: 102 mmol/L (ref 96–106)
CO2: 18 mmol/L — ABNORMAL LOW (ref 20–29)
CREATININE: 0.74 mg/dL (ref 0.57–1.00)
Calcium: 9 mg/dL (ref 8.7–10.2)
GFR calc Af Amer: 118 mL/min/{1.73_m2} (ref >59)
GFR calc non Af Amer: 102 mL/min/{1.73_m2} (ref >59)
GLOBULIN, TOTAL: 2.1 g/dL (ref 1.5–4.5)
GLUCOSE: 85 mg/dL (ref 65–99)
Potassium: 6.2 mmol/L — ABNORMAL HIGH (ref 3.5–5.2)
SODIUM: 138 mmol/L (ref 134–144)
Total Protein: 7.1 g/dL (ref 6.0–8.5)

## 2017-07-11 LAB — THYROID ANTIBODIES
Thyroglobulin Antibody: <1 IU/mL (ref 0.0–0.9)
Thyroperoxidase Ab SerPl-aCnc: 12 IU/mL (ref 0–34)

## 2017-07-11 LAB — LIPID PANEL
CHOLESTEROL TOTAL: 199 mg/dL (ref 100–199)
Chol/HDL Ratio: 2.6 ratio (ref 0.0–4.4)
HDL: 76 mg/dL (ref >39)
LDL CALC: 102 mg/dL — AB (ref 0–99)
Triglycerides: 103 mg/dL (ref 0–149)
VLDL Cholesterol Cal: 21 mg/dL (ref 5–40)

## 2017-07-11 LAB — THYROID PANEL WITH TSH
Free Thyroxine Index: 1.8 (ref 1.2–4.9)
T3 UPTAKE RATIO: 23 % — AB (ref 24–39)
T4 TOTAL: 7.7 ug/dL (ref 4.5–12.0)
TSH: 5.01 u[IU]/mL — ABNORMAL HIGH (ref 0.450–4.500)

## 2017-07-12 LAB — CYTOLOGY - PAP

## 2017-07-17 ENCOUNTER — Other Ambulatory Visit: Payer: Self-pay | Admitting: Endocrinology

## 2017-07-20 ENCOUNTER — Encounter: Payer: Self-pay | Admitting: Endocrinology

## 2017-07-20 ENCOUNTER — Encounter: Payer: Self-pay | Admitting: Obstetrics and Gynecology

## 2017-07-22 ENCOUNTER — Other Ambulatory Visit: Payer: Self-pay | Admitting: Endocrinology

## 2017-07-22 ENCOUNTER — Telehealth: Payer: Self-pay | Admitting: Endocrinology

## 2017-07-22 ENCOUNTER — Other Ambulatory Visit: Payer: Self-pay

## 2017-07-22 DIAGNOSIS — E890 Postprocedural hypothyroidism: Secondary | ICD-10-CM

## 2017-07-22 MED ORDER — LEVOTHYROXINE SODIUM 112 MCG PO TABS: 112.0000 ug | ORAL_TABLET | Freq: Every day | ORAL | 1 refills | Status: DC

## 2017-07-22 MED ORDER — LEVOTHYROXINE SODIUM 125 MCG PO TABS: 125.0000 ug | ORAL_TABLET | Freq: Every day | ORAL | 0 refills | Status: DC

## 2017-07-22 NOTE — Telephone Encounter (Signed)
Test results are in my chart, she need a refill of levothyroxine (SYNTHROID, LEVOTHROID) 112 MCG tablet [[id number]]   [id number] [address], [place] DEA #:  [id number]

## 2017-07-22 NOTE — Telephone Encounter (Signed)
Patient sent a my chart message and doesn't believe the doctor understood what she was sending. She would like to decrease the medication   Please advise    MY CHART MESSAGE  Can you change my thyroid meds again?  I had labs done at my OBGYN and my levels are still on the high side. Had blood work done on 3/27. You can review my chart. I rather not come in since I just had it done. Thank you.

## 2017-07-23 ENCOUNTER — Other Ambulatory Visit (INDEPENDENT_AMBULATORY_CARE_PROVIDER_SITE_OTHER): Payer: 59

## 2017-07-23 DIAGNOSIS — C73 Malignant neoplasm of thyroid gland: Secondary | ICD-10-CM

## 2017-07-23 LAB — TSH: TSH: 1.18 u[IU]/mL (ref 0.35–4.50)

## 2017-07-23 LAB — T4, FREE: Free T4: 1.1 ng/dL (ref 0.60–1.60)

## 2017-07-23 NOTE — Telephone Encounter (Signed)
Please advise of below. See 3/27 labs.

## 2017-07-23 NOTE — Telephone Encounter (Signed)
Her thyroid level indicates she is not getting enough medication.  She is not interpreting the results correctly

## 2017-07-23 NOTE — Telephone Encounter (Signed)
MD increased dose. See meds. Levothyroxine 125 mcg sent in on 07/22/17. Pt informed

## 2017-07-23 NOTE — Telephone Encounter (Signed)
I spoke to patient and explained her thyroid levels and informed her of the hemolyzed specimen which most likely caused her abnormal kcl on 07/10/17. She states her GYN is going to recheck her kcl in 30 days. I advised her to f/u with Dr. Dwyane Dee in 4-6 weeks to discuss thyroid conditions.

## 2017-07-25 LAB — TGAB+THYROGLOBULIN IMA OR LCMS: Thyroglobulin Antibody: <1 IU/mL (ref 0.0–0.9)

## 2017-07-25 LAB — THYROGLOBULIN BY IMA: Thyroglobulin by IMA: 0.7 ng/mL — ABNORMAL LOW (ref 1.5–38.5)

## 2017-07-31 ENCOUNTER — Other Ambulatory Visit: Payer: Self-pay | Admitting: Endocrinology

## 2017-07-31 DIAGNOSIS — C73 Malignant neoplasm of thyroid gland: Secondary | ICD-10-CM

## 2017-08-06 ENCOUNTER — Other Ambulatory Visit: Payer: Self-pay | Admitting: Endocrinology

## 2017-08-06 DIAGNOSIS — C73 Malignant neoplasm of thyroid gland: Secondary | ICD-10-CM

## 2017-08-20 ENCOUNTER — Other Ambulatory Visit: Payer: Self-pay | Admitting: Endocrinology

## 2017-08-20 DIAGNOSIS — E890 Postprocedural hypothyroidism: Secondary | ICD-10-CM

## 2017-08-21 ENCOUNTER — Other Ambulatory Visit: Payer: Self-pay | Admitting: Endocrinology

## 2017-08-21 DIAGNOSIS — E890 Postprocedural hypothyroidism: Secondary | ICD-10-CM

## 2017-08-27 ENCOUNTER — Other Ambulatory Visit: Payer: Self-pay | Admitting: Endocrinology

## 2017-09-05 ENCOUNTER — Encounter: Payer: BLUE CROSS/BLUE SHIELD | Admitting: Obstetrics and Gynecology

## 2017-09-24 ENCOUNTER — Encounter (HOSPITAL_COMMUNITY)
Admission: RE | Admit: 2017-09-24 | Discharge: 2017-09-24 | Disposition: A | Discharge status: Home / Self Care | Payer: 59 | Source: Ambulatory Visit | Attending: Endocrinology | Admitting: Endocrinology

## 2017-09-24 DIAGNOSIS — C73 Malignant neoplasm of thyroid gland: Secondary | ICD-10-CM | POA: Insufficient documentation

## 2017-09-24 MED ORDER — THYROTROPIN ALFA 1.1 MG IM SOLR
INTRAMUSCULAR | Status: AC
  Filled 2017-09-24: qty 0.9

## 2017-09-24 MED ORDER — THYROTROPIN ALFA 1.1 MG IM SOLR
0.9000 mg | INTRAMUSCULAR | Status: AC
  Administered 2017-09-24: 0.9 mg via INTRAMUSCULAR

## 2017-09-25 ENCOUNTER — Encounter (HOSPITAL_COMMUNITY)
Admission: RE | Admit: 2017-09-25 | Discharge: 2017-09-25 | Disposition: A | Discharge status: Home / Self Care | Payer: 59 | Source: Ambulatory Visit | Attending: Endocrinology | Admitting: Endocrinology

## 2017-09-25 DIAGNOSIS — C73 Malignant neoplasm of thyroid gland: Secondary | ICD-10-CM | POA: Diagnosis not present

## 2017-09-25 MED ORDER — THYROTROPIN ALFA 1.1 MG IM SOLR
INTRAMUSCULAR | Status: AC
  Filled 2017-09-25: qty 0.9

## 2017-09-25 MED ORDER — THYROTROPIN ALFA 1.1 MG IM SOLR
0.9000 mg | INTRAMUSCULAR | Status: AC
  Administered 2017-09-25: 0.9 mg via INTRAMUSCULAR

## 2017-09-26 ENCOUNTER — Encounter (HOSPITAL_COMMUNITY)
Admission: RE | Admit: 2017-09-26 | Discharge: 2017-09-26 | Disposition: A | Discharge status: Home / Self Care | Payer: 59 | Source: Ambulatory Visit | Attending: Endocrinology | Admitting: Endocrinology

## 2017-09-26 DIAGNOSIS — C73 Malignant neoplasm of thyroid gland: Secondary | ICD-10-CM | POA: Diagnosis not present

## 2017-09-26 LAB — HCG, SERUM, QUALITATIVE: Preg, Serum: NEGATIVE

## 2017-09-26 MED ORDER — SODIUM IODIDE I 131 CAPSULE
47.1000 | Freq: Once | INTRAVENOUS | Status: AC
  Administered 2017-09-26: 47.1 via ORAL

## 2017-10-04 ENCOUNTER — Encounter (HOSPITAL_COMMUNITY): Admission: RE | Admit: 2017-10-04 | Payer: 59 | Source: Ambulatory Visit

## 2017-10-07 ENCOUNTER — Encounter (HOSPITAL_COMMUNITY)
Admission: RE | Admit: 2017-10-07 | Discharge: 2017-10-07 | Disposition: A | Discharge status: Home / Self Care | Payer: 59 | Source: Ambulatory Visit | Attending: Endocrinology | Admitting: Endocrinology

## 2017-10-07 ENCOUNTER — Other Ambulatory Visit: Payer: Self-pay | Admitting: Endocrinology

## 2017-10-07 DIAGNOSIS — C73 Malignant neoplasm of thyroid gland: Secondary | ICD-10-CM

## 2017-10-19 ENCOUNTER — Encounter: Payer: Self-pay | Admitting: Obstetrics and Gynecology

## 2017-10-19 ENCOUNTER — Other Ambulatory Visit: Payer: Self-pay | Admitting: Obstetrics and Gynecology

## 2017-10-19 ENCOUNTER — Encounter: Payer: Self-pay | Admitting: Endocrinology

## 2017-10-27 ENCOUNTER — Other Ambulatory Visit: Payer: Self-pay | Admitting: Obstetrics and Gynecology

## 2017-11-05 ENCOUNTER — Encounter: Payer: Self-pay | Admitting: Obstetrics and Gynecology

## 2017-11-16 ENCOUNTER — Other Ambulatory Visit: Payer: Self-pay | Admitting: Endocrinology

## 2017-11-16 DIAGNOSIS — E890 Postprocedural hypothyroidism: Secondary | ICD-10-CM

## 2017-11-28 ENCOUNTER — Other Ambulatory Visit: Payer: Self-pay | Admitting: *Deleted

## 2017-11-28 ENCOUNTER — Telehealth: Payer: Self-pay | Admitting: Obstetrics and Gynecology

## 2017-11-28 MED ORDER — LORAZEPAM 1 MG PO TABS: 1.0000 mg | ORAL_TABLET | Freq: Three times a day (TID) | ORAL | 2 refills | Status: DC

## 2017-11-28 NOTE — Telephone Encounter (Signed)
Pt called states her baby is having some issues and she is having increased anxiety, she would like to increase her lorazepam 1mg , she is having to take more than one a day, pls advise

## 2017-11-28 NOTE — Telephone Encounter (Signed)
The patient called and stated that she needs a call back from Amy is possible in regards to her needing a medication refill The patient did not disclose any other information, Please advise.

## 2017-11-28 NOTE — Telephone Encounter (Signed)
[facility]

## 2017-11-28 NOTE — Telephone Encounter (Signed)
Ok to refill at 1mg  tid prn, #90, 2 refills

## 2017-12-10 IMAGING — CR DG CHEST 2V
2 series · 2 of 2 positions shown · non-contrast
Comparison: No prior .

CLINICAL DATA: Thyroidectomy. Preoperative chest x-ray. Following
sensation in chest. Smoking history.

EXAM:
CHEST  2 VIEW

[w chest pa]
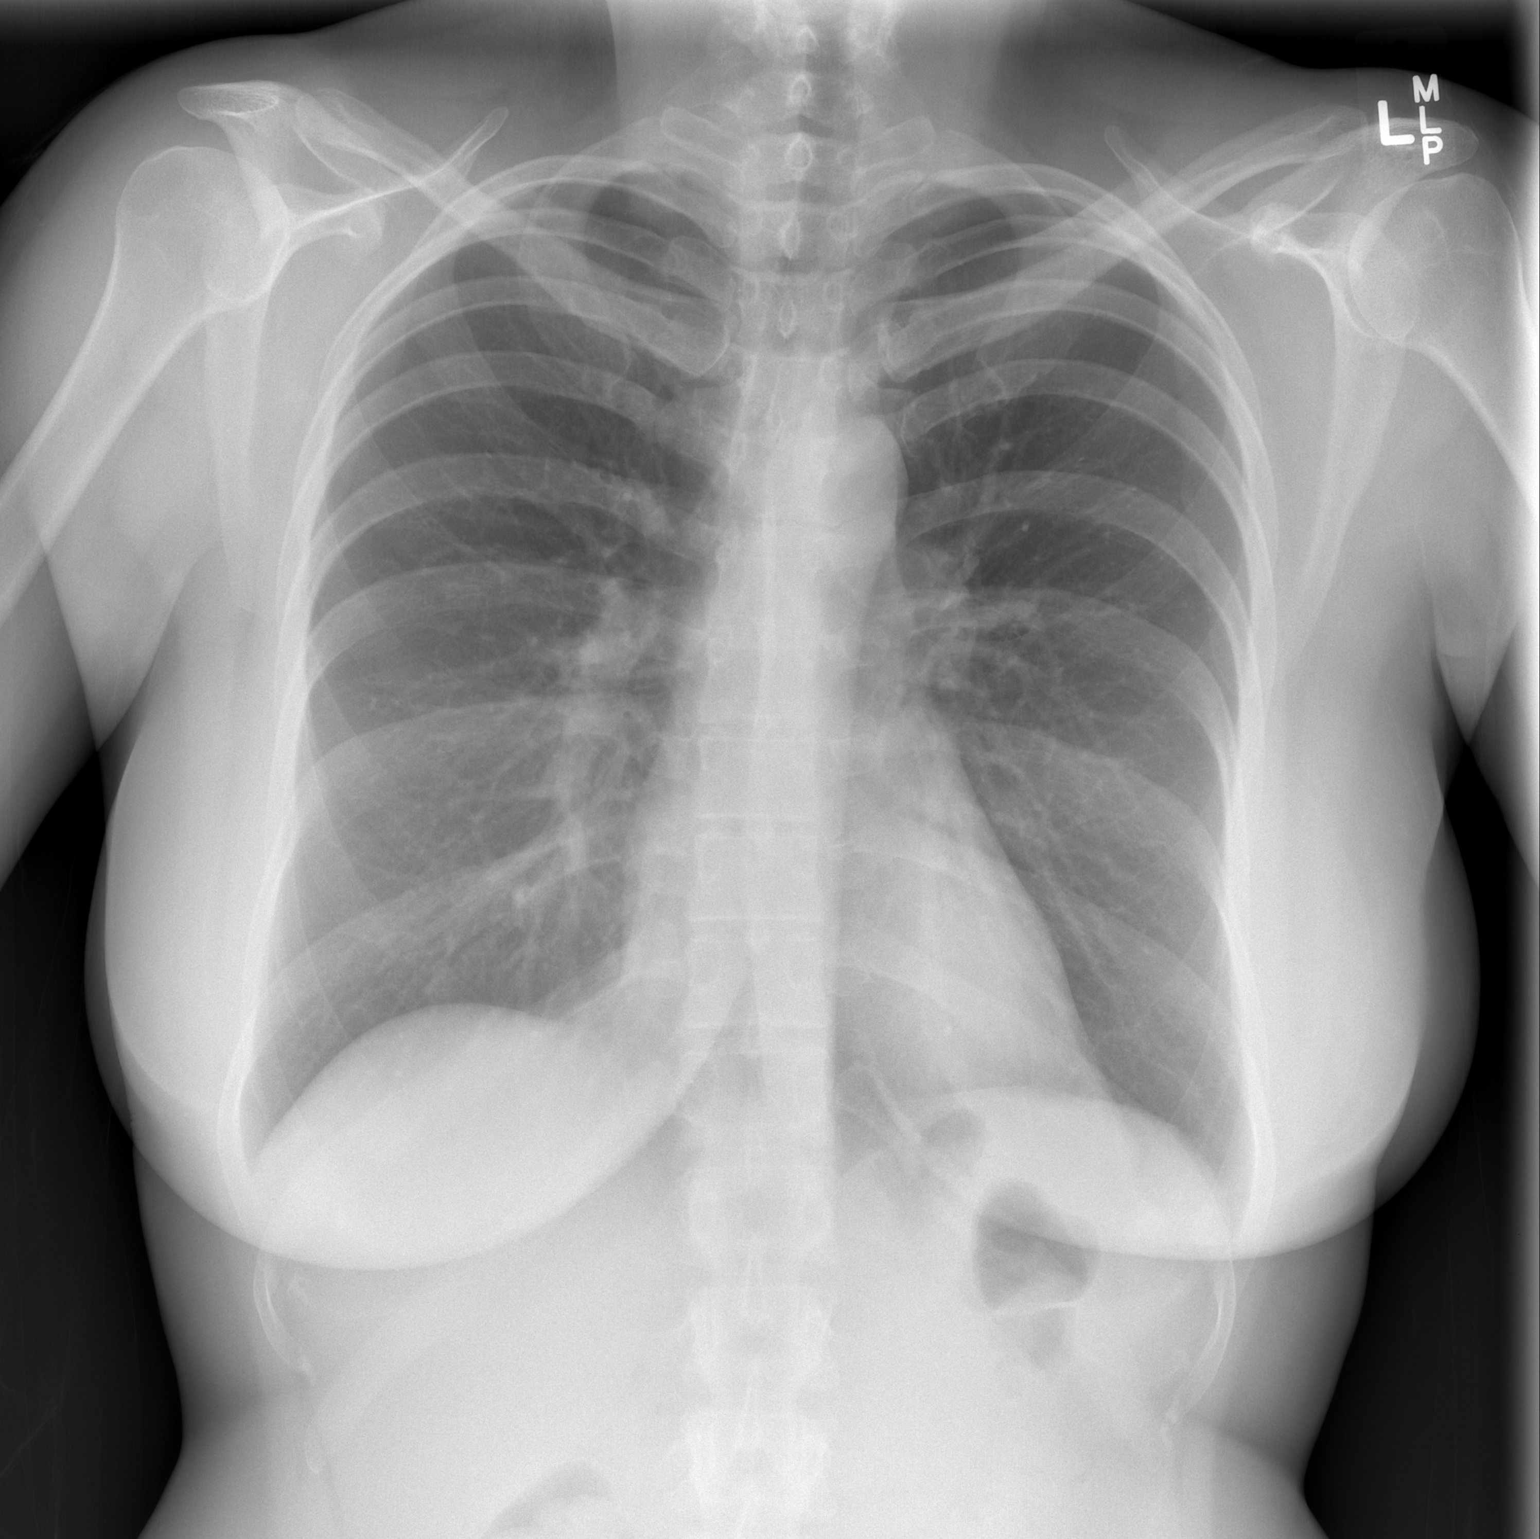

[w chest lat]
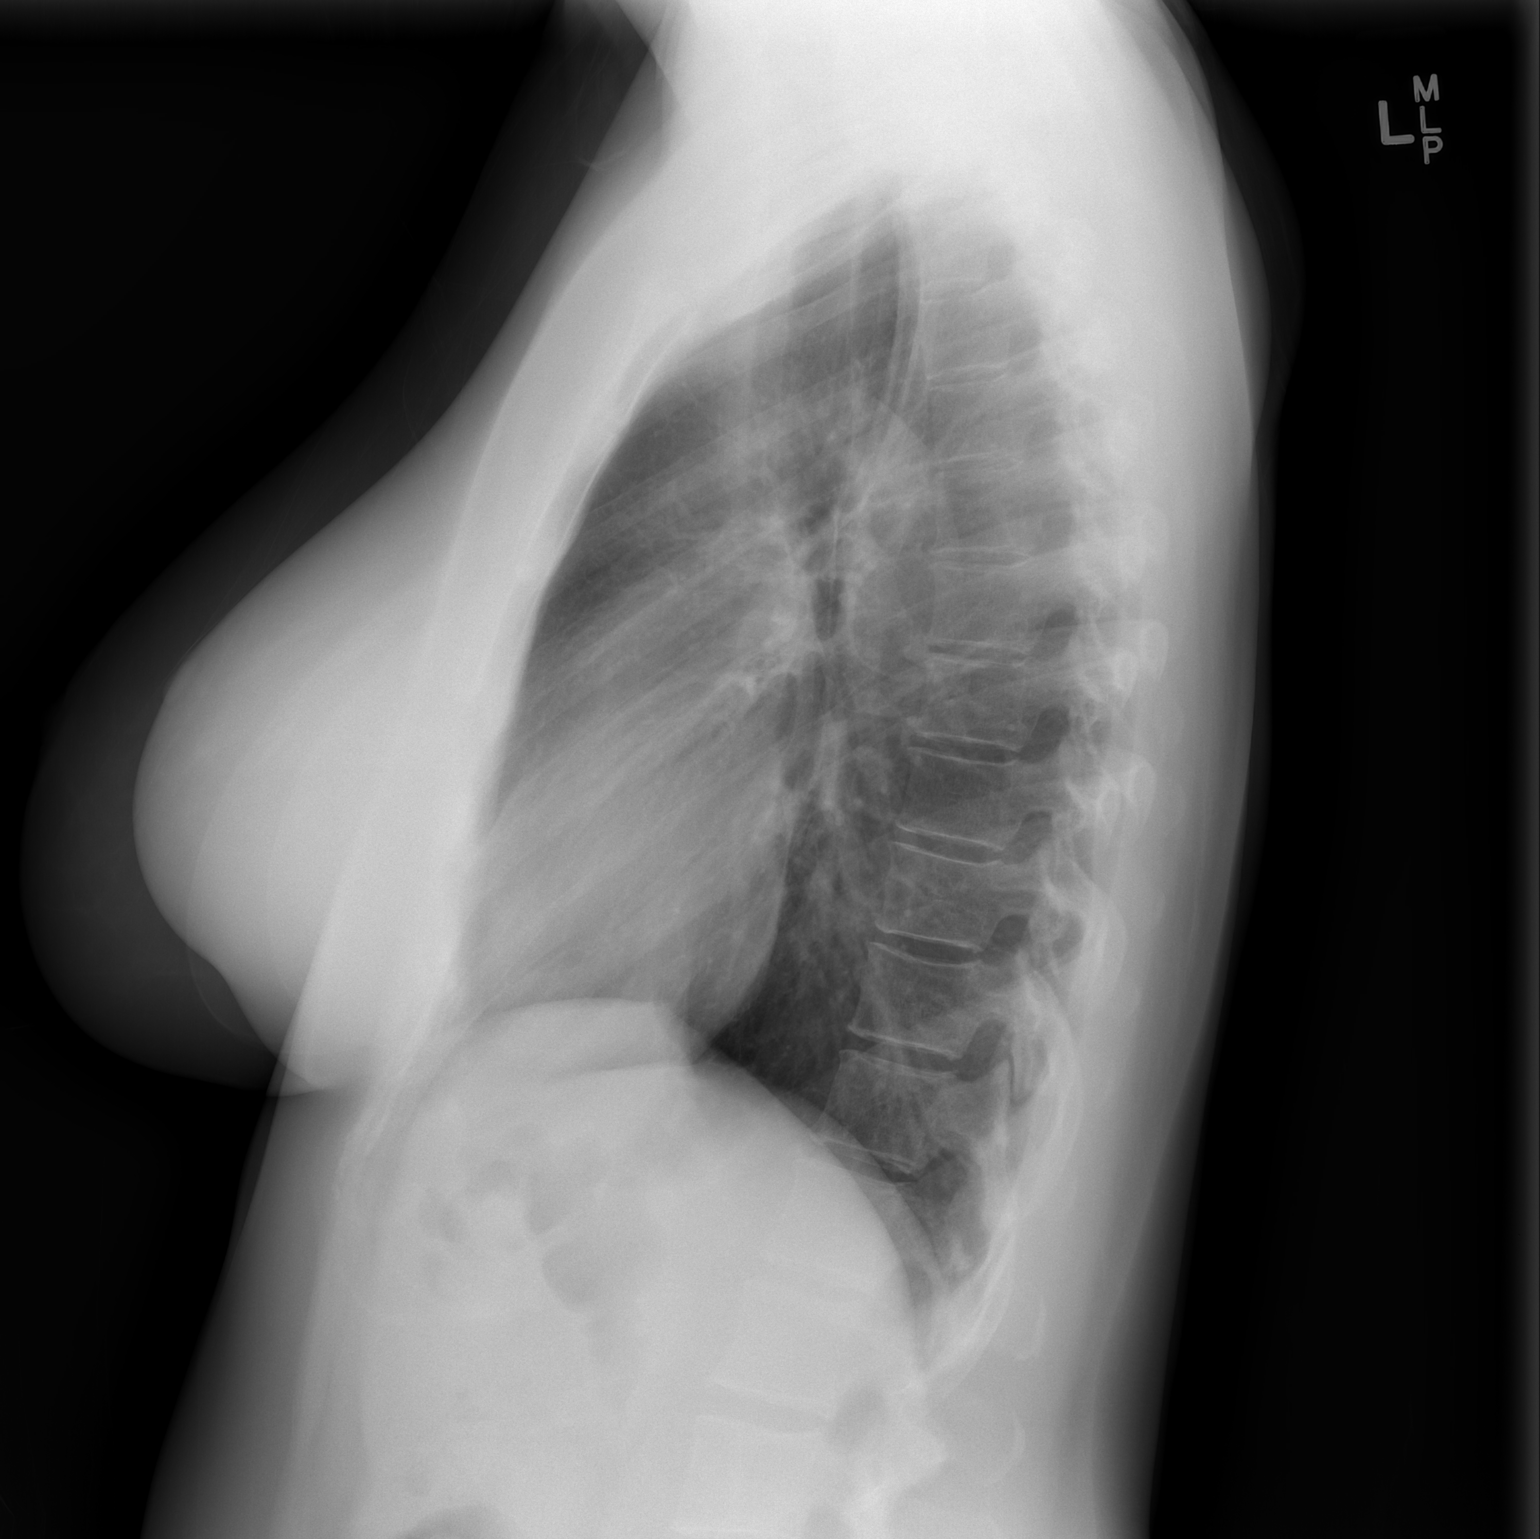

[2 of 2 positions shown; findings below may reference images not displayed]

FINDINGS: Mediastinum and hilar structures normal the lungs are clear. No
focal infiltrate. No pleural effusion or pneumothorax. Heart size
normal. No acute bony abnormality .
IMPRESSION: No acute abnormality .

## 2017-12-27 ENCOUNTER — Ambulatory Visit: Payer: Commercial Managed Care - HMO | Admitting: Endocrinology

## 2017-12-27 DIAGNOSIS — Z0289 Encounter for other administrative examinations: Secondary | ICD-10-CM

## 2018-02-03 ENCOUNTER — Other Ambulatory Visit: Payer: Self-pay | Admitting: Obstetrics and Gynecology

## 2018-02-13 ENCOUNTER — Other Ambulatory Visit: Payer: Self-pay

## 2018-02-13 DIAGNOSIS — E890 Postprocedural hypothyroidism: Secondary | ICD-10-CM

## 2018-02-13 MED ORDER — LEVOTHYROXINE SODIUM 125 MCG PO TABS: 125.0000 ug | ORAL_TABLET | Freq: Every day | ORAL | 0 refills | Status: DC

## 2018-04-21 ENCOUNTER — Other Ambulatory Visit: Payer: Self-pay | Admitting: Obstetrics and Gynecology

## 2018-04-21 ENCOUNTER — Other Ambulatory Visit: Payer: Self-pay | Admitting: *Deleted

## 2018-04-21 MED ORDER — LORAZEPAM 1 MG PO TABS: 1.0000 mg | ORAL_TABLET | Freq: Three times a day (TID) | ORAL | 2 refills | Status: DC

## 2018-05-12 ENCOUNTER — Other Ambulatory Visit: Payer: Self-pay | Admitting: Endocrinology

## 2018-05-12 DIAGNOSIS — E890 Postprocedural hypothyroidism: Secondary | ICD-10-CM

## 2018-05-12 MED ORDER — LEVOTHYROXINE SODIUM 125 MCG PO TABS: 125.0000 ug | ORAL_TABLET | Freq: Every day | ORAL | 0 refills | Status: DC

## 2018-05-13 ENCOUNTER — Other Ambulatory Visit: Payer: Self-pay | Admitting: Endocrinology

## 2018-05-13 DIAGNOSIS — E890 Postprocedural hypothyroidism: Secondary | ICD-10-CM

## 2018-05-20 ENCOUNTER — Other Ambulatory Visit: Payer: Self-pay | Admitting: *Deleted

## 2018-05-20 MED ORDER — OSELTAMIVIR PHOSPHATE 75 MG PO CAPS: 75.0000 mg | ORAL_CAPSULE | Freq: Two times a day (BID) | ORAL | 1 refills | Status: DC

## 2018-05-30 ENCOUNTER — Encounter: Payer: 59 | Admitting: Obstetrics and Gynecology

## 2018-05-30 ENCOUNTER — Other Ambulatory Visit: Payer: 59

## 2018-06-04 ENCOUNTER — Encounter: Payer: 59 | Admitting: Obstetrics and Gynecology

## 2018-06-04 ENCOUNTER — Other Ambulatory Visit: Payer: 59

## 2018-06-04 ENCOUNTER — Other Ambulatory Visit: Payer: 59 | Admitting: Obstetrics and Gynecology

## 2018-06-18 ENCOUNTER — Other Ambulatory Visit: Payer: Self-pay

## 2018-06-18 ENCOUNTER — Encounter: Payer: Self-pay | Admitting: Obstetrics and Gynecology

## 2018-06-18 ENCOUNTER — Ambulatory Visit: Payer: 59 | Admitting: Obstetrics and Gynecology

## 2018-06-18 ENCOUNTER — Other Ambulatory Visit: Payer: 59

## 2018-06-18 DIAGNOSIS — E663 Overweight: Secondary | ICD-10-CM

## 2018-06-18 DIAGNOSIS — C73 Malignant neoplasm of thyroid gland: Secondary | ICD-10-CM | POA: Diagnosis not present

## 2018-06-18 MED ORDER — PHENTERMINE HCL 37.5 MG PO TABS: 37.5000 mg | ORAL_TABLET | Freq: Every day | ORAL | 2 refills | Status: DC

## 2018-06-18 MED ORDER — CYANOCOBALAMIN 1000 MCG/ML IJ SOLN: 1000.0000 ug | INTRAMUSCULAR | 1 refills | Status: DC

## 2018-06-18 NOTE — Patient Instructions (Signed)
 Preventive Care 41-39 Years, Female Preventive care refers to lifestyle choices and visits with your health care provider that can promote health and wellness. What does preventive care include?   A yearly physical exam. This is also called an annual well check.  Dental exams once or twice a year.  Routine eye exams. Ask your health care provider how often you should have your eyes checked.  Personal lifestyle choices, including: ? Daily care of your teeth and gums. ? Regular physical activity. ? Eating a healthy diet. ? Avoiding tobacco and drug use. ? Limiting alcohol use. ? Practicing safe sex. ? Taking vitamin and mineral supplements as recommended by your health care provider. What happens during an annual well check? The services and screenings done by your health care provider during your annual well check will depend on your age, overall health, lifestyle risk factors, and family history of disease. Counseling Your health care provider may ask you questions about your:  Alcohol use.  Tobacco use.  Drug use.  Emotional well-being.  Home and relationship well-being.  Sexual activity.  Eating habits.  Work and work environment.  Method of birth control.  Menstrual cycle.  Pregnancy history. Screening You may have the following tests or measurements:  Height, weight, and BMI.  Diabetes screening. This is done by checking your blood sugar (glucose) after you have not eaten for a while (fasting).  Blood pressure.  Lipid and cholesterol levels. These may be checked every 5 years starting at age 20.  Skin check.  Hepatitis C blood test.  Hepatitis B blood test.  Sexually transmitted disease (STD) testing.  BRCA-related cancer screening. This may be done if you have a family history of breast, ovarian, tubal, or peritoneal cancers.  Pelvic exam and Pap test. This may be done every 3 years starting at age 21. Starting at age 30, this may be done  every 5 years if you have a Pap test in combination with an HPV test. Discuss your test results, treatment options, and if necessary, the need for more tests with your health care provider. Vaccines Your health care provider may recommend certain vaccines, such as:  Influenza vaccine. This is recommended every year.  Tetanus, diphtheria, and acellular pertussis (Tdap, Td) vaccine. You may need a Td booster every 10 years.  Varicella vaccine. You may need this if you have not been vaccinated.  HPV vaccine. If you are 26 or younger, you may need three doses over 6 months.  Measles, mumps, and rubella (MMR) vaccine. You may need at least one dose of MMR. You may also need a second dose.  Pneumococcal 13-valent conjugate (PCV13) vaccine. You may need this if you have certain conditions and were not previously vaccinated.  Pneumococcal polysaccharide (PPSV23) vaccine. You may need one or two doses if you smoke cigarettes or if you have certain conditions.  Meningococcal vaccine. One dose is recommended if you are age 19-21 years and a first-year college student living in a residence hall, or if you have one of several medical conditions. You may also need additional booster doses.  Hepatitis A vaccine. You may need this if you have certain conditions or if you travel or work in places where you may be exposed to hepatitis A.  Hepatitis B vaccine. You may need this if you have certain conditions or if you travel or work in places where you may be exposed to hepatitis B.  Haemophilus influenzae type b (Hib) vaccine. You may need this if   you have certain risk factors. Talk to your health care provider about which screenings and vaccines you need and how often you need them. This information is not intended to replace advice given to you by your health care provider. Make sure you discuss any questions you have with your health care provider. Document Released: 05/29/2001 Document Revised:  11/13/2016 Document Reviewed: 02/01/2015 Elsevier Interactive Patient Education  2019 Elsevier Inc.  

## 2018-06-19 LAB — LIPID PANEL
CHOLESTEROL TOTAL: 215 mg/dL — AB (ref 100–199)
Chol/HDL Ratio: 2.5 ratio (ref 0.0–4.4)
HDL: 85 mg/dL (ref >39)
LDL Calculated: 119 mg/dL — ABNORMAL HIGH (ref 0–99)
Triglycerides: 56 mg/dL (ref 0–149)
VLDL Cholesterol Cal: 11 mg/dL (ref 5–40)

## 2018-06-19 LAB — THYROID PANEL WITH TSH
Free Thyroxine Index: 1.8 (ref 1.2–4.9)
T3 UPTAKE RATIO: 23 % — AB (ref 24–39)
T4 TOTAL: 7.7 ug/dL (ref 4.5–12.0)
TSH: 4.73 u[IU]/mL — AB (ref 0.450–4.500)

## 2018-06-19 LAB — COMPREHENSIVE METABOLIC PANEL
ALBUMIN: 5 g/dL — AB (ref 3.8–4.8)
ALT: 9 IU/L (ref 0–32)
AST: 20 IU/L (ref 0–40)
Albumin/Globulin Ratio: 2.5 — ABNORMAL HIGH (ref 1.2–2.2)
Alkaline Phosphatase: 63 IU/L (ref 39–117)
BUN / CREAT RATIO: 21 (ref 9–23)
BUN: 16 mg/dL (ref 6–24)
Bilirubin Total: 0.4 mg/dL (ref 0.0–1.2)
CALCIUM: 9.7 mg/dL (ref 8.7–10.2)
CO2: 22 mmol/L (ref 20–29)
CREATININE: 0.78 mg/dL (ref 0.57–1.00)
Chloride: 100 mmol/L (ref 96–106)
GFR calc Af Amer: 110 mL/min/{1.73_m2} (ref >59)
GFR, EST NON AFRICAN AMERICAN: 95 mL/min/{1.73_m2} (ref >59)
GLOBULIN, TOTAL: 2 g/dL (ref 1.5–4.5)
GLUCOSE: 87 mg/dL (ref 65–99)
Potassium: 4.4 mmol/L (ref 3.5–5.2)
SODIUM: 139 mmol/L (ref 134–144)
Total Protein: 7 g/dL (ref 6.0–8.5)

## 2018-06-19 LAB — THYROID ANTIBODIES: Thyroperoxidase Ab SerPl-aCnc: 16 IU/mL (ref 0–34)

## 2018-06-19 LAB — HEMOGLOBIN A1C
ESTIMATED AVERAGE GLUCOSE: 94 mg/dL
Hgb A1c MFr Bld: 4.9 % (ref 4.8–5.6)

## 2018-06-19 LAB — VITAMIN D 25 HYDROXY (VIT D DEFICIENCY, FRACTURES): Vit D, 25-Hydroxy: 26.7 ng/mL — ABNORMAL LOW (ref 30.0–100.0)

## 2018-06-20 ENCOUNTER — Other Ambulatory Visit: Payer: Self-pay | Admitting: Obstetrics and Gynecology

## 2018-06-20 DIAGNOSIS — E559 Vitamin D deficiency, unspecified: Secondary | ICD-10-CM | POA: Insufficient documentation

## 2018-06-20 MED ORDER — VITAMIN D (ERGOCALCIFEROL) 1.25 MG (50000 UNIT) PO CAPS: 50000.0000 [IU] | ORAL_CAPSULE | ORAL | 1 refills | Status: DC

## 2018-07-15 ENCOUNTER — Telehealth: Payer: Self-pay | Admitting: Endocrinology

## 2018-07-15 NOTE — Telephone Encounter (Signed)
She needs to set up a telehealth visit.  I have not seen her for more than a year

## 2018-07-15 NOTE — Telephone Encounter (Signed)
Patient called stated that she had her labs drawn at Dr Marge Duncans office on 06/18/2018 and that they indicated her Thyroid was off.  She is asking if her medication can be adjusted based in the labs that were drawn by her primary care so that she does not have to come in for a visit or do a telehealth visit as she is still working.  Her number for call back is [phone] and during the day it is [phone]

## 2018-07-16 ENCOUNTER — Other Ambulatory Visit: Payer: Self-pay

## 2018-07-16 NOTE — Telephone Encounter (Signed)
Called pt and gave her MD message. Pt stated that she would schedule an appt. Pt was scheduled for telehealth visit for this Friday.

## 2018-07-18 ENCOUNTER — Ambulatory Visit (INDEPENDENT_AMBULATORY_CARE_PROVIDER_SITE_OTHER): Payer: 59 | Admitting: Endocrinology

## 2018-07-18 ENCOUNTER — Encounter: Payer: Self-pay | Admitting: Endocrinology

## 2018-07-18 ENCOUNTER — Other Ambulatory Visit: Payer: Self-pay

## 2018-07-18 DIAGNOSIS — C73 Malignant neoplasm of thyroid gland: Secondary | ICD-10-CM

## 2018-07-18 DIAGNOSIS — E890 Postprocedural hypothyroidism: Secondary | ICD-10-CM | POA: Diagnosis not present

## 2018-07-18 MED ORDER — LEVOTHYROXINE SODIUM 137 MCG PO TABS: 137.0000 ug | ORAL_TABLET | Freq: Every day | ORAL | 1 refills | Status: DC

## 2018-07-18 NOTE — Progress Notes (Addendum)
Patient ID: Leah Thompson, female   DOB: 12-09-1975, 43 y.o.   MRN: [id number]           Reason for Appointment: Follow-up of thyroid   Today's office visit was provided via telemedicine using video technique . Consent for the patient has been obtained . Location of the patient: Home . Location of the provider: Office Only the patient and myself were participating in the encounter      History of Present Illness:   HYPOTHYROIDISM, postsurgical:  He has not been seen in follow-up since 04/2017  On her last visit she was complaining of increasing fatigue and her TSH was 4.2 But also was having symptoms of insomnia and anxiety  Her levothyroxine dose at that time was 100 mcg daily Subsequently because of persistently high TSH her doses have been progressively increased Since 3/19 she has been taking 125 mcg of levothyroxine  With this she had felt better Recently she thinks that she has very occasionally missed her thyroid doses in the morning but not regularly, has been trying to take it in the morning before breakfast She has had more stress and does not have any consistent change in her energy level  Her TSH is upper normal at 4.7  No further issues with hypocalcemia  Labs:  Lab Results  Component Value Date   TSH 4.730 (H) 06/18/2018   TSH 1.18 07/23/2017   TSH 5.010 (H) 07/10/2017   FREET4 1.10 07/23/2017   FREET4 0.74 03/13/2017   FREET4 0.79 06/11/2016    Lab Results  Component Value Date   TSH 4.730 (H) 06/18/2018   TSH 1.18 07/23/2017   TSH 5.010 (H) 07/10/2017   TSH 5.190 (H) 05/08/2017   TSH 4.22 04/26/2017    Wt Readings from Last 3 Encounters:  06/18/18 155 lb 3.2 oz (70.4 kg)  07/10/17 154 lb 1.6 oz (69.9 kg)  05/08/17 151 lb 9 oz (68.7 kg)    Lab Results  Component Value Date   CALCIUM 9.7 06/18/2018    THYROID cancer:  The patient's thyroid nodule was first discovered by herself in 5/17 when she felt her neck     The thyroid  ultrasound showed the following: Dominant solid minimally hypoechoic nodule with some internal cystic degeneration in the right mid gland measures 3.7 x 1.9 x 2.2 cm.  A smaller hypoechoic solid nodule in the upper gland measures 1.0 x 0.6 x 0.8 cm. Needle aspiration of this nodule showed benign follicular adenoma. 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 with another focus of 0.6 cm in the right side No lymph nodes were involved and the tumor was encapsulated with invasion into but not through the capsule  She was treated with I-131 and 09/2017 with 47 mCi which she tolerated well Follow-up scan showed only activity in the thyroid bed She has not had a clinical exam since her last visit in 1/19  Thyroglobulin levels have been as follows:  Lab Results  Component Value Date   THGAB <1.0 06/18/2018   THGAB <1.0 07/23/2017   THGAB <1.0 07/10/2017      Allergies as of 07/18/2018   No Known Allergies     Medication List       Accurate as of July 18, 2018 10:32 AM. Always use your most recent med list.        HAIR/SKIN/NAILS PO Take 1 capsule by mouth 4 (four) times a week.   levothyroxine 125 MCG tablet Commonly known as:  SYNTHROID, LEVOTHROID TAKE 1 TABLET BY MOUTH EVERY DAY   LORazepam 1 MG tablet Commonly known as:  ATIVAN TAKE 1 TABLET BY MOUTH THREE TIMES DAILY   Vitamin D (Ergocalciferol) 1.25 MG (50000 UT) Caps capsule Commonly known as:  DRISDOL Take 1 capsule (50,000  Units total) by mouth every 7 (seven) days.       Allergies: No Known Allergies  Past Medical History:  Diagnosis Date  . Anxiety   . Headache    migraines  . HELLP (hemolytic anemia/elev liver enzymes/low platelets in pregnancy)   . HELLP (hemolytic anemia/elev liver enzymes/low platelets in pregnancy)   . Hypothyroidism    nodule  . Preeclampsia      Past Surgical History:  Procedure Laterality Date  . BREAST SURGERY     augmentation  . CESAREAN SECTION  02/05/14   HELLP syndrome  . THYROID LOBECTOMY Right 03/01/2017   Procedure: RIGHT THYROID LOBECTOMY;  Surgeon: Armandina Gemma, MD;  Location: [facility];  Service: General;  Laterality: Right;  . THYROIDECTOMY N/A 03/25/2017   Procedure: COMPLETION THYROIDECTOMY;  Surgeon: Armandina Gemma, MD;  Location: [facility];  Service: General;  Laterality: N/A;  . TONSILLECTOMY  2005    Family History  Problem Relation Age of Onset  . Heart disease Father     Social History:  reports that she has quit smoking. She has never used smokeless tobacco. She reports that she does not drink alcohol or use drugs.   Review of Systems:   Anxiety: She takes lorazepam as needed from her PCP  She has been supplemented by her gynecologist with vitamin D   Examination:   There were no vitals taken for this visit.           Assessment/Plan:  Thyroid cancer, follicular variant of papillary carcinoma She has had a 4 cm and a 0.6 cm tumor on the right After I-131 treatment her thyroglobulin level has been still undetectable  Reminded the patient that she needs to come in for a clinical exam on her next visit  HYPOTHYROIDISM: She has postsurgical hypothyroidism, needing relatively higher doses of levothyroxine since after her surgery when she was only taking 100 mcg Discussed timing of taking the thyroid medication and need to take regularly She has had only occasional missed doses and discussed that if she forgets in the morning she can  take her medication later that day  She has no consistent symptoms of fatigue but also has had some stress With her TSH being 4.7 her dose appears to be inadequate  She will increase her dose to 137 g daily and follow-up in 2 months     Elayne Snare 07/18/2018

## 2018-07-25 ENCOUNTER — Other Ambulatory Visit: Payer: Self-pay | Admitting: Obstetrics and Gynecology

## 2018-07-28 ENCOUNTER — Other Ambulatory Visit: Payer: Self-pay | Admitting: *Deleted

## 2018-07-28 MED ORDER — LORAZEPAM 1 MG PO TABS: 1.0000 mg | ORAL_TABLET | Freq: Two times a day (BID) | ORAL | 2 refills | Status: DC

## 2018-07-30 ENCOUNTER — Other Ambulatory Visit: Payer: Self-pay | Admitting: Obstetrics and Gynecology

## 2018-08-01 ENCOUNTER — Telehealth: Payer: Self-pay | Admitting: Obstetrics and Gynecology

## 2018-08-01 ENCOUNTER — Other Ambulatory Visit: Payer: Self-pay | Admitting: Obstetrics and Gynecology

## 2018-08-01 NOTE — Telephone Encounter (Signed)
The patient called and stated that her medication refill for LORazepam (ATIVAN) 1 MG tablet was not authorized/declined. The patient had to schedule her annual out further due to covid-19. The patient is hoping to have her medication refilled. Please advise.

## 2018-08-01 NOTE — Telephone Encounter (Signed)
Med sent-ac

## 2018-08-04 ENCOUNTER — Other Ambulatory Visit: Payer: Self-pay | Admitting: Endocrinology

## 2018-08-04 DIAGNOSIS — E559 Vitamin D deficiency, unspecified: Secondary | ICD-10-CM

## 2018-08-04 DIAGNOSIS — L659 Nonscarring hair loss, unspecified: Secondary | ICD-10-CM

## 2018-08-12 ENCOUNTER — Other Ambulatory Visit: Payer: Self-pay | Admitting: Endocrinology

## 2018-08-12 DIAGNOSIS — E890 Postprocedural hypothyroidism: Secondary | ICD-10-CM

## 2018-09-03 ENCOUNTER — Other Ambulatory Visit: Payer: Self-pay | Admitting: Endocrinology

## 2018-09-03 DIAGNOSIS — E890 Postprocedural hypothyroidism: Secondary | ICD-10-CM

## 2018-09-15 ENCOUNTER — Other Ambulatory Visit: Payer: 59

## 2018-09-16 NOTE — Progress Notes (Deleted)
Patient ID: Leah Thompson, female   DOB: 1976/03/17, 43 y.o.   MRN: [id number]           Reason for Appointment: Follow-up of thyroid   Today's office visit was provided via telemedicine using video technique . Consent for the patient has been obtained . Location of the patient: Home . Location of the provider: Office Only the patient and myself were participating in the encounter      History of Present Illness:   HYPOTHYROIDISM, postsurgical:  He has not been seen in follow-up since 04/2017  On her last visit she was complaining of increasing fatigue and her TSH was 4.2 But also was having symptoms of insomnia and anxiety  Her levothyroxine dose at that time was 100 mcg daily Subsequently because of persistently high TSH her doses have been progressively increased Since 3/19 she has been taking 125 mcg of levothyroxine  With this she had felt better Recently she thinks that she has very occasionally missed her thyroid doses in the morning but not regularly, has been trying to take it in the morning before breakfast She has had more stress and does not have any consistent change in her energy level  Her TSH is upper normal at 4.7  No further issues with hypocalcemia  Labs:  Lab Results  Component Value Date   TSH 4.730 (H) 06/18/2018   TSH 1.18 07/23/2017   TSH 5.010 (H) 07/10/2017   FREET4 1.10 07/23/2017   FREET4 0.74 03/13/2017   FREET4 0.79 06/11/2016    Lab Results  Component Value Date   TSH 4.730 (H) 06/18/2018   TSH 1.18 07/23/2017   TSH 5.010 (H) 07/10/2017   TSH 5.190 (H) 05/08/2017   TSH 4.22 04/26/2017    Wt Readings from Last 3 Encounters:  06/18/18 155 lb 3.2 oz (70.4 kg)  07/10/17 154 lb 1.6 oz (69.9 kg)  05/08/17 151 lb 9 oz (68.7 kg)    Lab Results  Component Value Date   CALCIUM 9.7 06/18/2018    THYROID cancer:  The patient's thyroid nodule was first discovered by herself in 5/17 when she felt her neck     The thyroid  ultrasound showed the following: Dominant solid minimally hypoechoic nodule with some internal cystic degeneration in the right mid gland measures 3.7 x 1.9 x 2.2 cm.  A smaller hypoechoic solid nodule in the upper gland measures 1.0 x 0.6 x 0.8 cm. Needle aspiration of this nodule showed benign follicular adenoma. 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 with another focus of 0.6 cm in the right side No lymph nodes were involved and the tumor was encapsulated with invasion into but not through the capsule  She was treated with I-131 and 09/2017 with 47 mCi which she tolerated well Follow-up scan showed only activity in the thyroid bed She has not had a clinical exam since her last visit in 1/19  Thyroglobulin levels have been as follows:  Lab Results  Component Value Date   THGAB <1.0 06/18/2018   THGAB <1.0 07/23/2017   THGAB <1.0 07/10/2017      Allergies as of 09/17/2018   No Known Allergies     Medication List       Accurate as of September 16, 2018  9:24 PM. If you have any questions, ask your nurse or doctor.        HAIR/SKIN/NAILS PO Take 1 capsule by mouth 4 (four) times a week.   levothyroxine 137 MCG tablet Commonly known as:  SYNTHROID TAKE 1 TABLET BY MOUTH EVERY DAY   LORazepam 1 MG tablet Commonly known as:  ATIVAN TAKE 1 TABLET BY MOUTH 3 TIMES A DAY   Vitamin D (Ergocalciferol) 1.25 MG (50000 UT) Caps capsule Commonly known as:  DRISDOL Take 1 capsule (50,000  Units total) by mouth every 7 (seven) days.       Allergies: No Known Allergies  Past Medical History:  Diagnosis Date  . Anxiety   . Headache    migraines  . HELLP (hemolytic anemia/elev liver enzymes/low platelets in pregnancy)   . HELLP (hemolytic anemia/elev liver enzymes/low platelets in pregnancy)   . Hypothyroidism    nodule  . Preeclampsia      Past Surgical History:  Procedure Laterality Date  . BREAST SURGERY     augmentation  . CESAREAN SECTION  02/05/14   HELLP syndrome  . THYROID LOBECTOMY Right 03/01/2017   Procedure: RIGHT THYROID LOBECTOMY;  Surgeon: Armandina Gemma, MD;  Location: [facility];  Service: General;  Laterality: Right;  . THYROIDECTOMY N/A 03/25/2017   Procedure: COMPLETION THYROIDECTOMY;  Surgeon: Armandina Gemma, MD;  Location: [facility];  Service: General;  Laterality: N/A;  . TONSILLECTOMY  2005    Family History  Problem Relation Age of Onset  . Heart disease Father     Social History:  reports that she has quit smoking. She has never used smokeless tobacco. She reports that she does not drink alcohol or use drugs.   Review of Systems:   Anxiety: She takes lorazepam as needed from her PCP  She has been supplemented by her gynecologist with vitamin D   Examination:   There were no vitals taken for this visit.           Assessment/Plan:  Thyroid cancer, follicular variant of papillary carcinoma She has had a 4 cm and a 0.6 cm tumor on the right After I-131 treatment her thyroglobulin level has been still undetectable  Reminded the patient that she needs to come in for a clinical exam on her next visit  HYPOTHYROIDISM: She has postsurgical hypothyroidism, needing relatively higher doses of levothyroxine since after her surgery when she was only taking 100 mcg Discussed timing of taking the thyroid medication and need to take regularly She has had only occasional missed doses and discussed that if she forgets in the morning she can  take her medication later that day  She has no consistent symptoms of fatigue but also has had some stress With her TSH being 4.7 her dose appears to be inadequate  She will increase her dose to 137 g daily and follow-up in 2 months     Elayne Snare 09/16/2018

## 2018-09-17 ENCOUNTER — Ambulatory Visit: Payer: 59 | Admitting: Endocrinology

## 2018-09-17 ENCOUNTER — Telehealth: Payer: Self-pay | Admitting: Endocrinology

## 2018-09-17 NOTE — Telephone Encounter (Signed)
LMTCB and reschedule missed labs and appointment per provider request

## 2018-09-23 ENCOUNTER — Encounter: Payer: 59 | Admitting: Obstetrics and Gynecology

## 2018-09-30 ENCOUNTER — Other Ambulatory Visit: Payer: Self-pay | Admitting: *Deleted

## 2018-09-30 MED ORDER — LORAZEPAM 1 MG PO TABS: 1.0000 mg | ORAL_TABLET | Freq: Three times a day (TID) | ORAL | 2 refills | Status: DC

## 2018-10-03 ENCOUNTER — Other Ambulatory Visit: Payer: Self-pay | Admitting: Endocrinology

## 2018-10-03 DIAGNOSIS — E890 Postprocedural hypothyroidism: Secondary | ICD-10-CM

## 2018-10-14 ENCOUNTER — Ambulatory Visit: Payer: 59 | Admitting: Gastroenterology

## 2018-10-14 VITALS — Ht 65.0 in | Wt 155.0 lb

## 2018-10-14 NOTE — Progress Notes (Signed)
I called and spoke with the patient this morning.  She prefers an in person visit rather than telemedicine visit. We are happy to arrange this. Once our schedules are out she will be called so that we can get her in in person visit. Patient appreciative for the ability to be seen in person.  Justice Britain, MD [facility] Office # [phone]

## 2018-10-23 ENCOUNTER — Other Ambulatory Visit: Payer: Self-pay | Admitting: Endocrinology

## 2018-10-23 DIAGNOSIS — E890 Postprocedural hypothyroidism: Secondary | ICD-10-CM

## 2018-11-06 ENCOUNTER — Other Ambulatory Visit: Payer: Self-pay | Admitting: Endocrinology

## 2018-11-06 DIAGNOSIS — E890 Postprocedural hypothyroidism: Secondary | ICD-10-CM

## 2018-11-06 NOTE — Telephone Encounter (Signed)
Noted  

## 2018-11-06 NOTE — Telephone Encounter (Signed)
Ok to refill 

## 2018-11-06 NOTE — Telephone Encounter (Signed)
She can be given 15 tablets with note to make appointment with labs

## 2018-11-21 ENCOUNTER — Other Ambulatory Visit: Payer: Self-pay

## 2018-11-21 ENCOUNTER — Other Ambulatory Visit (INDEPENDENT_AMBULATORY_CARE_PROVIDER_SITE_OTHER): Payer: BC Managed Care – PPO

## 2018-11-21 DIAGNOSIS — E559 Vitamin D deficiency, unspecified: Secondary | ICD-10-CM | POA: Diagnosis not present

## 2018-11-21 DIAGNOSIS — C73 Malignant neoplasm of thyroid gland: Secondary | ICD-10-CM | POA: Diagnosis not present

## 2018-11-21 DIAGNOSIS — E890 Postprocedural hypothyroidism: Secondary | ICD-10-CM

## 2018-11-21 DIAGNOSIS — L659 Nonscarring hair loss, unspecified: Secondary | ICD-10-CM

## 2018-11-21 LAB — CBC
HCT: 38.7 % (ref 36.0–46.0)
Hemoglobin: 13.1 g/dL (ref 12.0–15.0)
MCHC: 34 g/dL (ref 30.0–36.0)
MCV: 94 fl (ref 78.0–100.0)
Platelets: 239 10*3/uL (ref 150.0–400.0)
RBC: 4.11 Mil/uL (ref 3.87–5.11)
RDW: 12.4 % (ref 11.5–15.5)
WBC: 6.2 10*3/uL (ref 4.0–10.5)

## 2018-11-21 LAB — VITAMIN D 25 HYDROXY (VIT D DEFICIENCY, FRACTURES): VITD: 62.74 ng/mL (ref 30.00–100.00)

## 2018-11-21 LAB — TSH: TSH: 0.32 u[IU]/mL — ABNORMAL LOW (ref 0.35–4.50)

## 2018-11-21 LAB — T4, FREE: Free T4: 0.89 ng/dL (ref 0.60–1.60)

## 2018-11-22 LAB — THYROGLOBULIN BY IMA: Thyroglobulin by IMA: <0.1 ng/mL — ABNORMAL LOW (ref 1.5–38.5)

## 2018-11-22 LAB — TGAB+THYROGLOBULIN IMA OR LCMS: Thyroglobulin Antibody: <1 IU/mL (ref 0.0–0.9)

## 2018-12-05 ENCOUNTER — Encounter: Payer: Self-pay | Admitting: Endocrinology

## 2018-12-05 ENCOUNTER — Other Ambulatory Visit: Payer: Self-pay

## 2018-12-05 ENCOUNTER — Encounter: Payer: 59 | Admitting: Obstetrics and Gynecology

## 2018-12-05 ENCOUNTER — Ambulatory Visit (INDEPENDENT_AMBULATORY_CARE_PROVIDER_SITE_OTHER): Payer: 59 | Admitting: Endocrinology

## 2018-12-05 DIAGNOSIS — E559 Vitamin D deficiency, unspecified: Secondary | ICD-10-CM

## 2018-12-05 DIAGNOSIS — E890 Postprocedural hypothyroidism: Secondary | ICD-10-CM

## 2018-12-05 DIAGNOSIS — C73 Malignant neoplasm of thyroid gland: Secondary | ICD-10-CM | POA: Diagnosis not present

## 2018-12-05 NOTE — Progress Notes (Signed)
Patient ID: Leah Thompson, female   DOB: 1976/01/07, 43 y.o.   MRN: [id number]           Reason for Appointment: Follow-up of thyroid   Today's office visit was provided via telemedicine using video technique . Consent for the patient has been obtained . Location of the patient: Home . Location of the provider: Office Only the patient and myself were participating in the encounter     History of Present Illness:   HYPOTHYROIDISM, postsurgical:  On her last visit she was complaining of increasing fatigue and her TSH was 4.2 But also was having symptoms of insomnia and anxiety  Her levothyroxine dose at that time was 100 mcg daily Subsequently because of persistently high TSH her doses have been progressively increased  Since 07/2018 she has been taking 137 mcg of levothyroxine  She felt less tired after increasing the dose Also however she is doing a less stressful job now She does not think her weight has changed She is very consistent with taking her levothyroxine before breakfast with water and only takes her biotin and vitamin D in the mornings  Her TSH which was upper normal at 4.7 is now 0.32    Labs:  Lab Results  Component Value Date   TSH 0.32 (L) 11/21/2018   TSH 4.730 (H) 06/18/2018   TSH 1.18 07/23/2017   FREET4 0.89 11/21/2018   FREET4 1.10 07/23/2017   FREET4 0.74 03/13/2017    Lab Results  Component Value Date   TSH 0.32 (L) 11/21/2018   TSH 4.730 (H) 06/18/2018   TSH 1.18 07/23/2017   TSH 5.010 (H) 07/10/2017   TSH 5.190 (H) 05/08/2017    Wt Readings from Last 3 Encounters:  10/14/18 155 lb (70.3 kg)  06/18/18 155 lb 3.2 oz (70.4 kg)  07/10/17 154 lb 1.6 oz (69.9 kg)    Lab Results  Component Value Date   CALCIUM 9.7 06/18/2018    THYROID cancer:  The patient's thyroid nodule was first discovered by herself in 5/17 when she felt her neck     The thyroid ultrasound showed the following: Dominant solid minimally hypoechoic  nodule with some internal cystic degeneration in the right mid gland measures 3.7 x 1.9 x 2.2 cm.  A smaller hypoechoic solid nodule in the upper gland measures 1.0 x 0.6 x 0.8 cm. Needle aspiration of this nodule showed benign follicular adenoma. 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 with another focus of 0.6 cm in the right side No lymph nodes were involved and the tumor was encapsulated with invasion into but not through the capsule  She was treated with I-131 and 09/2017 with 47 mCi which she tolerated well Follow-up scan showed only activity in the thyroid bed She has not had a clinical exam since her visit in 1/19 She does not feel any swelling  Thyroglobulin levels have been consistently undetectable  Lab Results  Component Value Date   THGAB <1.0 11/21/2018  THGAB <1.0 06/18/2018   THGAB <1.0 07/23/2017   THGAB <1.0 07/10/2017      Allergies as of 12/05/2018   No Known Allergies     Medication List       Accurate as of December 05, 2018  8:14 AM. If you have any questions, ask your nurse or doctor.        HAIR/SKIN/NAILS PO Take 1 capsule by mouth 4 (four) times a week.   levothyroxine 137 MCG tablet Commonly known as: SYNTHROID Take 1 tab by mouth once daily. **must be seen for labs and appt for future refills per Dr. Dwyane Dee**   LORazepam 1 MG tablet Commonly known as: ATIVAN Take 1 tablet (1 mg total) by mouth 3 (three) times daily.   Vitamin D (Ergocalciferol)  1.25 MG (50000 UT) Caps capsule Commonly known as: DRISDOL Take 1 capsule (50,000 Units total) by mouth every 7 (seven) days.       Allergies: No Known Allergies  Past Medical History:  Diagnosis Date  . Anxiety   . Headache    migraines  . HELLP (hemolytic anemia/elev liver enzymes/low platelets in pregnancy)   . HELLP (hemolytic anemia/elev liver enzymes/low platelets in pregnancy)   . Hypothyroidism    nodule  . Preeclampsia      Past Surgical History:  Procedure Laterality Date  . BREAST SURGERY     augmentation  . CESAREAN SECTION  02/05/14   HELLP syndrome  . THYROID LOBECTOMY Right 03/01/2017   Procedure: RIGHT THYROID LOBECTOMY;  Surgeon: Armandina Gemma, MD;  Location: [facility];  Service: General;  Laterality: Right;  . THYROIDECTOMY N/A 03/25/2017   Procedure: COMPLETION THYROIDECTOMY;  Surgeon: Armandina Gemma, MD;  Location: [facility];  Service: General;  Laterality: N/A;  . TONSILLECTOMY  2005    Family History  Problem Relation Age of Onset  . Heart disease Father     Social History:  reports that she has quit smoking. She has never used smokeless tobacco. She reports that she does not drink alcohol or use drugs.    Review of Systems:   She has been supplemented by her gynecologist with vitamin D, baseline level 27 She is taking 50,000 units weekly and this has not been changed  Lab Results  Component Value Date   VD25OH 62.74 11/21/2018   VD25OH 26.7 (L) 06/18/2018   Lab Results  Component Value Date   CALCIUM 9.7 06/18/2018      Examination:   There were no vitals taken for this visit.           Assessment/Plan:    HYPOTHYROIDISM:  She has postsurgical hypothyroidism, needing progressively higher doses of levothyroxine  Since after her surgery when she was only taking 100 mcg Discussed timing of taking the thyroid medication and need to take regularly She has had only occasional missed doses and discussed that if she forgets in the  morning she can take her medication later that day  She has no consistent symptoms of fatigue but also has had some stress With her TSH being 4.7 her dose appears to be inadequate  She will continue her dose of 137 g but take 6-1/2 tablets a week  Follow-up in 4 months   Vitamin D deficiency: She has been on high-dose supplements and since her level is in the 60s now she can go to maintenance dose of 50,000 units once a month instead of once a week Since she is premenopausal and her calcium previously has been low does  not need to take any calcium supplements but encouraged her to increase dietary foods high in calcium  History of mild hair loss: This is better now and discussed that she should not need to take biotin which is of doubtful benefit anyway  Thyroid cancer, follicular variant of papillary carcinoma She has had a 4 cm and a 0.6 cm tumor on the right After I-131 treatment her thyroglobulin level has been consistently undetectable  Reminded the patient that she needs to come in for a clinical exam on her next visit  Total visit time for evaluation and management of multiple problems and counseling =25 minutes   Elayne Snare 12/05/2018

## 2018-12-07 ENCOUNTER — Other Ambulatory Visit: Payer: Self-pay | Admitting: Endocrinology

## 2018-12-07 ENCOUNTER — Other Ambulatory Visit: Payer: Self-pay

## 2018-12-07 DIAGNOSIS — E890 Postprocedural hypothyroidism: Secondary | ICD-10-CM

## 2018-12-08 MED ORDER — LEVOTHYROXINE SODIUM 137 MCG PO TABS: ORAL_TABLET | ORAL | 0 refills | Status: DC

## 2018-12-10 ENCOUNTER — Other Ambulatory Visit: Payer: Self-pay

## 2018-12-10 DIAGNOSIS — Z20822 Contact with and (suspected) exposure to covid-19: Secondary | ICD-10-CM

## 2018-12-10 DIAGNOSIS — R6889 Other general symptoms and signs: Secondary | ICD-10-CM | POA: Diagnosis not present

## 2018-12-11 LAB — NOVEL CORONAVIRUS, NAA: SARS-CoV-2, NAA: NOT DETECTED

## 2019-01-05 ENCOUNTER — Other Ambulatory Visit: Payer: Self-pay | Admitting: Endocrinology

## 2019-01-05 DIAGNOSIS — E890 Postprocedural hypothyroidism: Secondary | ICD-10-CM

## 2019-02-03 ENCOUNTER — Other Ambulatory Visit: Payer: Self-pay | Admitting: Obstetrics and Gynecology

## 2019-02-04 ENCOUNTER — Other Ambulatory Visit: Payer: Self-pay | Admitting: Obstetrics and Gynecology

## 2019-02-05 ENCOUNTER — Other Ambulatory Visit: Payer: Self-pay | Admitting: Obstetrics and Gynecology

## 2019-03-06 ENCOUNTER — Telehealth: Payer: Self-pay

## 2019-03-06 ENCOUNTER — Other Ambulatory Visit: Payer: Self-pay

## 2019-03-06 MED ORDER — FLUCONAZOLE 150 MG PO TABS: 150.0000 mg | ORAL_TABLET | Freq: Once | ORAL | 1 refills | Status: AC

## 2019-03-06 NOTE — Telephone Encounter (Signed)
Diflucan sent per patient request.

## 2019-03-30 ENCOUNTER — Other Ambulatory Visit: Payer: BC Managed Care – PPO

## 2019-03-31 ENCOUNTER — Other Ambulatory Visit: Payer: Self-pay | Admitting: Certified Nurse Midwife

## 2019-03-31 MED ORDER — LORAZEPAM 1 MG PO TABS: 1.0000 mg | ORAL_TABLET | Freq: Three times a day (TID) | ORAL | 0 refills | Status: DC

## 2019-03-31 NOTE — Progress Notes (Signed)
Order placed for one time refill on Lorazepam. Recommended to pt that she follow up with behavioral health specialist to manage ativan . Offered to place referral.    Philip Aspen, CNM

## 2019-04-02 ENCOUNTER — Ambulatory Visit: Payer: BC Managed Care – PPO | Admitting: Endocrinology

## 2019-04-04 ENCOUNTER — Other Ambulatory Visit: Payer: Self-pay | Admitting: Endocrinology

## 2019-04-04 DIAGNOSIS — E890 Postprocedural hypothyroidism: Secondary | ICD-10-CM

## 2019-04-08 ENCOUNTER — Other Ambulatory Visit: Payer: Self-pay

## 2019-04-08 DIAGNOSIS — E890 Postprocedural hypothyroidism: Secondary | ICD-10-CM

## 2019-04-08 MED ORDER — LEVOTHYROXINE SODIUM 137 MCG PO TABS: ORAL_TABLET | ORAL | 0 refills | Status: DC

## 2019-04-08 NOTE — Telephone Encounter (Signed)
FYI

## 2019-04-14 ENCOUNTER — Encounter: Payer: Self-pay | Admitting: Certified Nurse Midwife

## 2019-04-14 ENCOUNTER — Other Ambulatory Visit (HOSPITAL_COMMUNITY)
Admission: RE | Admit: 2019-04-14 | Discharge: 2019-04-14 | Disposition: A | Discharge status: Home / Self Care | Payer: 59 | Source: Ambulatory Visit | Attending: Certified Nurse Midwife | Admitting: Certified Nurse Midwife

## 2019-04-14 ENCOUNTER — Other Ambulatory Visit: Payer: Self-pay

## 2019-04-14 ENCOUNTER — Ambulatory Visit (INDEPENDENT_AMBULATORY_CARE_PROVIDER_SITE_OTHER): Payer: 59 | Admitting: Certified Nurse Midwife

## 2019-04-14 VITALS — BP 99/73 | HR 86 | Ht 65.0 in | Wt 149.0 lb

## 2019-04-14 DIAGNOSIS — Z1231 Encounter for screening mammogram for malignant neoplasm of breast: Secondary | ICD-10-CM

## 2019-04-14 DIAGNOSIS — Z01419 Encounter for gynecological examination (general) (routine) without abnormal findings: Secondary | ICD-10-CM | POA: Diagnosis present

## 2019-04-14 NOTE — Patient Instructions (Signed)

## 2019-04-14 NOTE — Progress Notes (Addendum)
GYNECOLOGY ANNUAL PREVENTATIVE CARE ENCOUNTER NOTE  History:     Leah Thompson is a 43 y.o. [id number] female here for a routine annual gynecologic exam.  Current complaints: fatigue, hair loss. PT has appointment with endocrinology January. Will have work up done there.   Denies abnormal vaginal bleeding, discharge, pelvic pain, problems with intercourse or other gynecologic concerns.     Relationship:married Children: daughter  Work:  Exercise: no Smoke, drink, drugs: denies   Gynecologic History No LMP recorded. Contraception: none Last Pap: 07/10/2017 Results were: normal with negative HPV Last mammogram: has not had done   Obstetric History OB History  Gravida Para Term Preterm AB Living  2       1 1   SAB TAB Ectopic Multiple Live Births  1       1    # Outcome Date GA Lbr Len/2nd Weight Sex Delivery Anes PTL Lv  2 Gravida 02/05/14    F CS-Unspec   LIV  1 SAB             Past Medical History:  Diagnosis Date  . Anxiety   . Headache    migraines  . HELLP (hemolytic anemia/elev liver enzymes/low platelets in pregnancy)   . HELLP (hemolytic anemia/elev liver enzymes/low platelets in pregnancy)   . Hypothyroidism    nodule  . Preeclampsia     Past Surgical History:  Procedure Laterality Date  . BREAST SURGERY     augmentation  . CESAREAN SECTION  02/05/14   HELLP syndrome  . THYROID LOBECTOMY Right 03/01/2017   Procedure: RIGHT THYROID LOBECTOMY;  Surgeon: Armandina Gemma, MD;  Location: [facility];  Service: General;  Laterality: Right;  . THYROIDECTOMY N/A 03/25/2017   Procedure: COMPLETION THYROIDECTOMY;  Surgeon: Armandina Gemma, MD;  Location: [facility];  Service: General;  Laterality: N/A;  . TONSILLECTOMY  2005    Current Outpatient Medications on File Prior to Visit  Medication Sig Dispense Refill  . Biotin w/ Vitamins C & E (HAIR/SKIN/NAILS PO) Take 1 capsule by mouth 4 (four) times a week.     . levothyroxine (SYNTHROID) 137 MCG tablet Take 1 tablet by  mouth 6 days per week and 1/2 tablet on day 7. 30 tablet 0  . LORazepam (ATIVAN) 1 MG tablet Take 1 tablet (1 mg total) by mouth 3 (three) times daily. 90 tablet 0  . Vitamin D, Ergocalciferol, (DRISDOL) 1.25 MG (50000 UT) CAPS capsule Take 1 capsule (50,000 Units total) by mouth every 7 (seven) days. 30 capsule 1   No current facility-administered medications on file prior to visit.    No Known Allergies  Social History:  reports that she has quit smoking. She has never used smokeless tobacco. She reports that she does not drink alcohol or use drugs.  Family History  Problem Relation Age of Onset  . Heart disease Father     The following portions of the patient's history were reviewed and updated as appropriate: allergies, current medications, past family history, past medical history, past social history, past surgical history and problem list.  Review of Systems Pertinent items noted in HPI and remainder of comprehensive ROS otherwise negative.  Physical Exam:  There were no vitals taken for this visit. CONSTITUTIONAL: Well-developed, well-nourished female in no acute distress.  HENT:  Normocephalic, atraumatic, External right and left ear normal. Oropharynx is clear and moist EYES: Conjunctivae and EOM are normal. Pupils are equal, round, and reactive to light. No scleral icterus.  NECK: Normal range of  motion, supple, no masses.  Normal thyroid.  SKIN: Skin is warm and dry. No rash noted. Not diaphoretic. No erythema. No pallor. MUSCULOSKELETAL: Normal range of motion. No tenderness.  No cyanosis, clubbing, or edema.  2+ distal pulses. NEUROLOGIC: Alert and oriented to person, place, and time. Normal reflexes, muscle tone coordination.  PSYCHIATRIC: Normal mood and affect. Normal behavior. Normal judgment and thought content. CARDIOVASCULAR: Normal heart rate noted, regular rhythm RESPIRATORY: Clear to auscultation bilaterally. Effort and breath sounds normal, no problems with  respiration noted. BREASTS: Symmetric in size. No masses, tenderness, skin changes, nipple drainage, or lymphadenopathy bilaterally. Bilateral breast implants present. ABDOMEN: Soft, no distention noted.  No tenderness, rebound or guarding.  PELVIC: Normal appearing external genitalia and urethral meatus; normal appearing vaginal mucosa and cervix.  No abnormal discharge noted.  Pap smear completed per pt request. nabotian cyst noted 10 o'clock.   Normal uterine size, no other palpable masses, no uterine or adnexal tenderness.   Assessment and Plan:  [facility] Exam  Pap completed per pt request Mammogram scheduled Labs: will have done at endocrinologist 1/21 Refills: none  Routine preventative health maintenance measures emphasized. Encouraged exercise to improve mood and fatigue.  Please refer to After Visit Summary for other counseling recommendations.      Philip Aspen, CNM

## 2019-04-16 LAB — CYTOLOGY - PAP
Comment: NEGATIVE
Diagnosis: NEGATIVE
High risk HPV: NEGATIVE

## 2019-05-01 ENCOUNTER — Other Ambulatory Visit (INDEPENDENT_AMBULATORY_CARE_PROVIDER_SITE_OTHER): Payer: BC Managed Care – PPO

## 2019-05-01 ENCOUNTER — Other Ambulatory Visit: Payer: BC Managed Care – PPO

## 2019-05-01 ENCOUNTER — Other Ambulatory Visit: Payer: Self-pay

## 2019-05-01 DIAGNOSIS — E890 Postprocedural hypothyroidism: Secondary | ICD-10-CM

## 2019-05-01 DIAGNOSIS — E559 Vitamin D deficiency, unspecified: Secondary | ICD-10-CM

## 2019-05-01 LAB — BASIC METABOLIC PANEL
BUN: 16 mg/dL (ref 6–23)
CO2: 27 mEq/L (ref 19–32)
Calcium: 9.1 mg/dL (ref 8.4–10.5)
Chloride: 104 mEq/L (ref 96–112)
Creatinine, Ser: 0.73 mg/dL (ref 0.40–1.20)
GFR: 87.55 mL/min (ref >60.00)
Glucose, Bld: 93 mg/dL (ref 70–99)
Potassium: 4.3 mEq/L (ref 3.5–5.1)
Sodium: 139 mEq/L (ref 135–145)

## 2019-05-01 LAB — TSH: TSH: 0.5 u[IU]/mL (ref 0.35–4.50)

## 2019-05-01 LAB — T4, FREE: Free T4: 1.29 ng/dL (ref 0.60–1.60)

## 2019-05-06 ENCOUNTER — Other Ambulatory Visit: Payer: Self-pay

## 2019-05-06 MED ORDER — FLUCONAZOLE 150 MG PO TABS: 150.0000 mg | ORAL_TABLET | Freq: Once | ORAL | 1 refills | Status: AC

## 2019-05-06 NOTE — Telephone Encounter (Signed)
Diflucan sent to preferred pharmacy per patient request. 

## 2019-05-07 NOTE — Progress Notes (Signed)
Patient ID: Leah Thompson, female   DOB: 05-18-1975, 44 y.o.   MRN: [id number]           Reason for Appointment: Follow-up of thyroid  I connected with the above-named patient by video enabled telemedicine application and verified that I am speaking with the correct person. The patient was explained the limitations of evaluation and management by telemedicine and the availability of in person appointments.  Patient also understood that there may be a patient responsible charge related to this service . Location of the patient: Patient's home . Location of the provider: Physician office Only the patient and myself were participating in the encounter The patient understood the above statements and agreed to proceed.    History of Present Illness:   HYPOTHYROIDISM, postsurgical:  Her levothyroxine dose previously was 100 mcg daily  TSH her doses had been progressively increased  Since 07/2018 she has been taking 137 mcg of levothyroxine  She had some fatigue when her TSH was 4.7 previously However in 8/20 since her TSH was slightly low she was told to take 6-1/2 tablets a week This is also on her prescription However she forgot the instructions and is taking 1 tablet 7 days a week Also did not come back as scheduled in December  She feels fairly good generally without any unexplained fatigue Periodically may feel warm but not consistently She is very consistent with taking her levothyroxine before breakfast with water  Her TSH which was slightly low is now 0.5 without any change in her dosage  She likes to take biotin and her free T4 is relatively higher than the last time  Labs:  Lab Results  Component Value Date   TSH 0.50 05/01/2019   TSH 0.32 (L) 11/21/2018   TSH 4.730 (H) 06/18/2018   FREET4 1.29 05/01/2019   FREET4 0.89 11/21/2018   FREET4 1.10 07/23/2017    Lab Results  Component Value Date   TSH 0.50 05/01/2019   TSH 0.32 (L) 11/21/2018   TSH 4.730  (H) 06/18/2018   TSH 1.18 07/23/2017   TSH 5.010 (H) 07/10/2017    Wt Readings from Last 3 Encounters:  04/14/19 149 lb (67.6 kg)  10/14/18 155 lb (70.3 kg)  06/18/18 155 lb 3.2 oz (70.4 kg)    Lab Results  Component Value Date   CALCIUM 9.1 05/01/2019    THYROID cancer:  The patient's thyroid nodule was first discovered by herself in 5/17 when she felt her neck     The thyroid ultrasound showed the following: Dominant solid minimally hypoechoic nodule with some internal cystic degeneration in the right mid gland measures 3.7 x 1.9 x 2.2 cm.  A smaller hypoechoic solid nodule in the upper gland measures 1.0 x 0.6 x 0.8 cm. Needle aspiration of this nodule showed benign follicular adenoma. 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 with another focus of 0.6 cm in the right side No lymph nodes were involved and the tumor was  encapsulated with invasion into but not through the capsule  She was treated with I-131 and 09/2017 with 47 mCi which she tolerated well Follow-up scan showed only activity in the thyroid bed She has not had a clinical exam since her visit in 1/19 She does not feel any swelling  Thyroglobulin levels have been consistently undetectable  Lab Results  Component Value Date   THGAB <1.0 11/21/2018   THGAB <1.0 06/18/2018   THGAB <1.0 07/23/2017   THGAB <1.0 07/10/2017      Allergies as of 05/08/2019   No Known Allergies     Medication List       Accurate  as of May 07, 2019  3:28 PM. If you have any questions, ask your nurse or doctor.        buPROPion 150 MG 24 hr tablet Commonly known as: WELLBUTRIN XL Take by mouth.   HAIR/SKIN/NAILS PO Take 1 capsule by mouth 4 (four) times a week.   levothyroxine 137 MCG tablet Commonly known as: SYNTHROID Take 1 tablet by mouth 6 days per week and 1/2 tablet on day 7.   LORazepam 1 MG tablet Commonly known as: ATIVAN Take 1 tablet (1 mg total) by mouth 3 (three) times daily.   Vitamin D (Ergocalciferol) 1.25 MG (50000 UNIT) Caps capsule Commonly known as: DRISDOL Take 1 capsule (50,000 Units total) by mouth every 7 (seven) days.       Allergies: No Known Allergies  Past Medical History:  Diagnosis Date  . Anxiety   . Headache    migraines  . HELLP (hemolytic anemia/elev liver enzymes/low platelets in pregnancy)   . HELLP (hemolytic anemia/elev liver enzymes/low platelets in pregnancy)   . Hypothyroidism    nodule  . Preeclampsia      Past Surgical History:  Procedure Laterality Date  . BREAST SURGERY     augmentation  . CESAREAN SECTION  02/05/14   HELLP syndrome  . THYROID LOBECTOMY Right 03/01/2017   Procedure: RIGHT THYROID LOBECTOMY;  Surgeon: Armandina Gemma, MD;  Location: [facility];  Service: General;  Laterality: Right;  . THYROIDECTOMY N/A 03/25/2017   Procedure: COMPLETION THYROIDECTOMY;  Surgeon: Armandina Gemma, MD;  Location: [facility];  Service: General;  Laterality: N/A;  . TONSILLECTOMY  2005    Family History  Problem Relation Age of Onset  . Heart disease Father     Social History:  reports that she has quit smoking. She has never used smokeless tobacco. She reports that she does not drink alcohol or use drugs.    Review of Systems:   She has been supplemented by her gynecologist with vitamin D, baseline level 27 She was prescribed 50,000 units weekly  Lab Results  Component Value Date   VD25OH 62.74 11/21/2018   VD25OH 26.7 (L) 06/18/2018   Lab  Results  Component Value Date   CALCIUM 9.1 05/01/2019      Examination:   There were no vitals taken for this visit.           Assessment/Plan:   HYPOTHYROIDISM:  She has postsurgical hypothyroidism  Is taking 137 mcg generic levothyroxine She gets her prescription from [facility] Although her TSH was only 0.3 in August it is now up to 0.5 without any dosage change She was told to take 6-1/2 tablets a week but she forgot to change her regimen  Subjectively doing well  She will continue her dose of 137 g unchanged  Follow-up in 4 months   Vitamin D deficiency: She  will follow up with her gynecologist  Thyroid cancer, follicular variant of papillary carcinoma She has had a 4 cm and a 0.6 cm tumor on the right After I-131 treatment her thyroglobulin level has been consistently undetectable This will be rechecked on her next visit and also needs a clinical exam   Elayne Snare 05/07/2019

## 2019-05-08 ENCOUNTER — Encounter: Payer: Self-pay | Admitting: Endocrinology

## 2019-05-08 ENCOUNTER — Other Ambulatory Visit: Payer: Self-pay

## 2019-05-08 ENCOUNTER — Ambulatory Visit (INDEPENDENT_AMBULATORY_CARE_PROVIDER_SITE_OTHER): Payer: 59 | Admitting: Endocrinology

## 2019-05-08 DIAGNOSIS — E559 Vitamin D deficiency, unspecified: Secondary | ICD-10-CM

## 2019-05-08 DIAGNOSIS — C73 Malignant neoplasm of thyroid gland: Secondary | ICD-10-CM

## 2019-05-08 DIAGNOSIS — E890 Postprocedural hypothyroidism: Secondary | ICD-10-CM

## 2019-05-08 MED ORDER — LEVOTHYROXINE SODIUM 137 MCG PO TABS: ORAL_TABLET | ORAL | 1 refills | Status: DC

## 2019-05-11 ENCOUNTER — Ambulatory Visit: Payer: 59 | Attending: Internal Medicine

## 2019-05-11 DIAGNOSIS — Z20822 Contact with and (suspected) exposure to covid-19: Secondary | ICD-10-CM

## 2019-05-12 LAB — NOVEL CORONAVIRUS, NAA: SARS-CoV-2, NAA: NOT DETECTED

## 2019-05-15 ENCOUNTER — Ambulatory Visit: Payer: 59 | Attending: Internal Medicine

## 2019-05-15 DIAGNOSIS — Z20822 Contact with and (suspected) exposure to covid-19: Secondary | ICD-10-CM

## 2019-05-16 LAB — NOVEL CORONAVIRUS, NAA: SARS-CoV-2, NAA: NOT DETECTED

## 2019-06-30 ENCOUNTER — Telehealth: Payer: Self-pay | Admitting: Certified Nurse Midwife

## 2019-06-30 NOTE — Telephone Encounter (Signed)
Called pt to move her appt with the right provider.

## 2019-07-03 ENCOUNTER — Encounter: Payer: 59 | Admitting: Certified Nurse Midwife

## 2019-07-06 ENCOUNTER — Encounter: Payer: 59 | Admitting: Certified Nurse Midwife

## 2019-07-24 ENCOUNTER — Other Ambulatory Visit: Payer: Self-pay

## 2019-07-24 ENCOUNTER — Other Ambulatory Visit (INDEPENDENT_AMBULATORY_CARE_PROVIDER_SITE_OTHER): Payer: 59

## 2019-07-24 DIAGNOSIS — E559 Vitamin D deficiency, unspecified: Secondary | ICD-10-CM | POA: Diagnosis not present

## 2019-07-24 DIAGNOSIS — C73 Malignant neoplasm of thyroid gland: Secondary | ICD-10-CM | POA: Diagnosis not present

## 2019-07-24 DIAGNOSIS — E890 Postprocedural hypothyroidism: Secondary | ICD-10-CM

## 2019-07-24 LAB — T4, FREE: Free T4: 0.8 ng/dL (ref 0.60–1.60)

## 2019-07-24 LAB — TSH: TSH: 1.61 u[IU]/mL (ref 0.35–4.50)

## 2019-07-24 LAB — VITAMIN D 25 HYDROXY (VIT D DEFICIENCY, FRACTURES): VITD: 33.01 ng/mL (ref 30.00–100.00)

## 2019-07-25 LAB — TGAB+THYROGLOBULIN IMA OR LCMS: Thyroglobulin Antibody: <1 IU/mL (ref 0.0–0.9)

## 2019-07-25 LAB — THYROGLOBULIN BY IMA: Thyroglobulin by IMA: <0.1 ng/mL — ABNORMAL LOW (ref 1.5–38.5)

## 2019-07-28 ENCOUNTER — Other Ambulatory Visit: Payer: Self-pay | Admitting: Certified Nurse Midwife

## 2019-07-28 MED ORDER — VITAMIN D (ERGOCALCIFEROL) 1.25 MG (50000 UNIT) PO CAPS: 50000.0000 [IU] | ORAL_CAPSULE | ORAL | 3 refills | Status: DC

## 2019-07-31 ENCOUNTER — Ambulatory Visit: Payer: 59 | Admitting: Endocrinology

## 2019-07-31 ENCOUNTER — Encounter: Payer: Self-pay | Admitting: Certified Nurse Midwife

## 2019-09-25 ENCOUNTER — Other Ambulatory Visit: Payer: Self-pay

## 2019-09-25 MED ORDER — FLUCONAZOLE 150 MG PO TABS
150.0000 mg | ORAL_TABLET | Freq: Once | ORAL | 1 refills | Status: AC
Start: 2019-09-25 — End: 2019-09-25

## 2019-10-01 ENCOUNTER — Other Ambulatory Visit: Payer: Self-pay

## 2019-10-28 ENCOUNTER — Other Ambulatory Visit: Payer: Self-pay | Admitting: Endocrinology

## 2019-10-28 DIAGNOSIS — E890 Postprocedural hypothyroidism: Secondary | ICD-10-CM

## 2019-10-30 ENCOUNTER — Other Ambulatory Visit: Payer: Self-pay

## 2019-10-30 ENCOUNTER — Ambulatory Visit (INDEPENDENT_AMBULATORY_CARE_PROVIDER_SITE_OTHER): Payer: 59 | Admitting: Endocrinology

## 2019-10-30 ENCOUNTER — Encounter: Payer: Self-pay | Admitting: Endocrinology

## 2019-10-30 VITALS — BP 124/76 | HR 77 | Ht 65.0 in | Wt 151.8 lb

## 2019-10-30 DIAGNOSIS — E890 Postprocedural hypothyroidism: Secondary | ICD-10-CM | POA: Diagnosis not present

## 2019-10-30 DIAGNOSIS — Z789 Other specified health status: Secondary | ICD-10-CM | POA: Diagnosis not present

## 2019-10-30 DIAGNOSIS — C73 Malignant neoplasm of thyroid gland: Secondary | ICD-10-CM | POA: Diagnosis not present

## 2019-10-30 NOTE — Progress Notes (Signed)
Patient ID: Leah Thompson, female   DOB: Nov 11, 1975, 44 y.o.   MRN: [id number]           Reason for Appointment: Follow-up of thyroid    History of Present Illness:   HYPOTHYROIDISM, postsurgical:  Her levothyroxine dose previously was 100 mcg daily When her TSH is higher her doses have been progressively increased  In 07/2018 she was taking 137 mcg of levothyroxine  She did not come back as scheduled in April after her labs  She has had some fatigue recently However she is concerned that she is gaining weight although overall her weight has been in the same range since 2018 Periodically may feel warm but not cold She is very consistent with taking her levothyroxine before breakfast with water although may take it at variable times, does not think she has coffee or food before taking this and no vitamins at the same time  Her TSH which was normal at 1.6 in April is now 5.7 done by PCP on 10/09/2019 At this point her PCP has increased her dose to 150 mcg levothyroxine, she still gets her medication from [facility] and does not know if her manufacturer changed  She is taking unknown vitamins for hair although her free T4 is on the last visit normal, not available recently  Labs:  Lab Results  Component Value Date   TSH 1.61 07/24/2019   TSH 0.50 05/01/2019   TSH 0.32 (L) 11/21/2018   FREET4 0.80 07/24/2019   FREET4 1.29 05/01/2019   FREET4 0.89 11/21/2018    Lab Results  Component Value Date   TSH 1.61 07/24/2019   TSH 0.50 05/01/2019   TSH 0.32 (L) 11/21/2018   TSH 4.730 (H) 06/18/2018   TSH 1.18 07/23/2017    Wt Readings from Last 3 Encounters:  10/30/19 151 lb 12.8 oz (68.9 kg)  04/14/19 149 lb (67.6 kg)  10/14/18 155 lb (70.3 kg)    Lab Results  Component Value Date   CALCIUM 9.1 05/01/2019    THYROID cancer:  The patient's thyroid nodule was first discovered by herself in 5/17 when she felt her neck     The thyroid ultrasound showed the  following: Dominant solid minimally hypoechoic nodule with some internal cystic degeneration in the right mid gland measures 3.7 x 1.9 x 2.2 cm.  A smaller hypoechoic solid nodule in the upper gland measures 1.0 x 0.6 x 0.8 cm. Needle aspiration of this nodule showed benign follicular adenoma. 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 with another focus of 0.6 cm in the right side No lymph nodes were involved and the tumor was encapsulated with invasion into but not through the capsule  She was treated with I-131 and 09/2017 with 47 mCi which she tolerated well Follow-up scan showed only activity in the thyroid bed  Thyroglobulin levels have been consistently undetectable as follows  Lab Results  Component Value Date   THGAB <1.0 07/24/2019   THGAB <1.0 11/21/2018  THGAB <1.0 06/18/2018   THGAB <1.0 07/23/2017      Allergies as of 10/30/2019   No Known Allergies     Medication List       Accurate as of October 30, 2019 10:08 AM. If you have any questions, ask your nurse or doctor.        buPROPion 150 MG 24 hr tablet Commonly known as: WELLBUTRIN XL Take by mouth.   HAIR/SKIN/NAILS PO Take 1 capsule by mouth 4 (four) times a week.   levothyroxine 150 MCG tablet Commonly known as: SYNTHROID Take 150 mcg by mouth daily before breakfast. What changed: Another medication with the same name was removed. Continue taking this medication, and follow the directions you see  here. Changed by: Elayne Snare, MD   LORazepam 1 MG tablet Commonly known as: ATIVAN Take 1 tablet (1 mg total) by mouth 3 (three) times daily.   Vitamin D (Ergocalciferol) 1.25 MG (50000 UNIT) Caps capsule Commonly known as: DRISDOL Take 1 capsule (50,000 Units total) by mouth every 7 (seven) days.       Allergies: No Known Allergies  Past Medical History:  Diagnosis Date  . Anxiety   . Headache    migraines  . HELLP (hemolytic anemia/elev liver enzymes/low platelets in pregnancy)   . HELLP (hemolytic anemia/elev liver enzymes/low platelets in pregnancy)   . Hypothyroidism    nodule  . Preeclampsia      Past Surgical History:  Procedure Laterality Date  . BREAST SURGERY     augmentation  . CESAREAN SECTION  02/05/14   HELLP syndrome  . THYROID LOBECTOMY Right 03/01/2017   Procedure: RIGHT THYROID LOBECTOMY;  Surgeon: Armandina Gemma, MD;  Location: [facility];  Service: General;  Laterality: Right;  . THYROIDECTOMY N/A 03/25/2017   Procedure: COMPLETION THYROIDECTOMY;  Surgeon: Armandina Gemma, MD;  Location: [facility];  Service: General;  Laterality: N/A;  . TONSILLECTOMY  2005    Family History  Problem Relation Age of Onset  . Heart disease Father     Social History:  reports that she has quit smoking. She has never used smokeless tobacco. She reports that she does not drink alcohol and does not use drugs.    Review of Systems:  WEIGHT gain:  Although she thinks that she is gaining weight her overall weight is within the same range over the last 3 years She thinks she feels better when her weight comes down Also she may have been given an unknown weight loss medicine in the past Currently she is eating relatively low carbohydrate diet, sometimes skipping breakfast and mostly eating low carbohydrate snacks like carrots Does not think she is snacking excessively or eating large portions or high-fat meals She is doing some walking with her dog in the evenings  Also  recently has been started on bupropion from her PCP possibly for depression  Wt Readings from Last 3 Encounters:  10/30/19 151 lb 12.8 oz (68.9 kg)  04/14/19 149 lb (67.6 kg)  10/14/18 155 lb (70.3 kg)    Vitamin D deficiency: Supplemented by her gynecologist with vitamin D, baseline level 27 She was prescribed 50,000 units weekly  Lab Results  Component Value Date   VD25OH 33.01 07/24/2019   VD25OH 62.74 11/21/2018   VD25OH 26.7 (L) 06/18/2018   Lab Results  Component Value Date   CALCIUM 9.1 05/01/2019    Hair loss: This is relatively chronic, she has some thinning of her hair but this has been about the same for  some time and not associated with any significant alopecia   Examination:   BP 124/76 (BP Location: Left Arm, Patient Position: Sitting)   Pulse 77   Ht 5\' 5"  (1.651 m)   Wt 151 lb 12.8 oz (68.9 kg)   SpO2 97%   BMI 25.26 kg/m          Her neck exam is normal, no mass or lymphadenopathy Skin temperature appears normal  Assessment/Plan:   HYPOTHYROIDISM:  She has postsurgical hypothyroidism  She has some mild nonspecific fatigue but also has had other issues including anxiety and depression No significant weight change  She was taking 137 mcg generic levothyroxine and this was just increased by PCP to 150 last month She gets her [facility] from [facility]  She will have her labs checked in another month or so and will adjust her dose accordingly If her TSH is fluctuating may consider brand-name instead of generic  WEIGHT gain: Currently this is minimal and not associated with any other risk factors She is asking for weight loss medicine but discussed that since she is already cutting back on portions, calories and carbohydrates successfully would not want to have her take anything short-term She does need to increase her exercise intensity and frequency.  Thyroid cancer, follicular variant of papillary carcinoma She has had a 4 cm and a 0.6 cm tumor on  the right After I-131 treatment her thyroglobulin level has been consistently undetectable Clinical exam is normal and reassured her that she has had adequate treatment including I-131 treatment with no signs of recurrence  Elayne Snare 10/30/2019

## 2019-12-04 ENCOUNTER — Other Ambulatory Visit: Payer: 59

## 2019-12-11 ENCOUNTER — Other Ambulatory Visit: Payer: 59

## 2019-12-11 ENCOUNTER — Other Ambulatory Visit (INDEPENDENT_AMBULATORY_CARE_PROVIDER_SITE_OTHER): Payer: 59

## 2019-12-11 ENCOUNTER — Other Ambulatory Visit: Payer: Self-pay

## 2019-12-11 DIAGNOSIS — E890 Postprocedural hypothyroidism: Secondary | ICD-10-CM

## 2019-12-11 LAB — T4, FREE: Free T4: 0.96 ng/dL (ref 0.60–1.60)

## 2019-12-11 LAB — TSH: TSH: 2.19 u[IU]/mL (ref 0.35–4.50)

## 2019-12-12 ENCOUNTER — Other Ambulatory Visit: Payer: Self-pay

## 2019-12-14 NOTE — Progress Notes (Signed)
Please call to let patient know that the lab results are perfect, stay on 150 mcg levothyroxine.  She will come back in 1/22 as scheduled with prior lab visit

## 2019-12-22 ENCOUNTER — Ambulatory Visit (INDEPENDENT_AMBULATORY_CARE_PROVIDER_SITE_OTHER): Payer: 59 | Admitting: Certified Nurse Midwife

## 2019-12-22 ENCOUNTER — Other Ambulatory Visit: Payer: Self-pay

## 2019-12-22 ENCOUNTER — Encounter: Payer: Self-pay | Admitting: Certified Nurse Midwife

## 2019-12-22 VITALS — BP 102/71 | HR 89 | Ht 65.0 in | Wt 150.2 lb

## 2019-12-22 DIAGNOSIS — N898 Other specified noninflammatory disorders of vagina: Secondary | ICD-10-CM

## 2019-12-22 MED ORDER — FLUCONAZOLE 150 MG PO TABS: 150.0000 mg | ORAL_TABLET | ORAL | 0 refills | Status: DC

## 2019-12-22 NOTE — Patient Instructions (Signed)
Vaginitis Vaginitis is a condition in which the vaginal tissue swells and becomes red (inflamed). This condition is most often caused by a change in the normal balance of bacteria and yeast that live in the vagina. This change causes an overgrowth of certain bacteria or yeast, which causes the inflammation. There are different types of vaginitis, but the most common types are:  Bacterial vaginosis.  Yeast infection (candidiasis).  Trichomoniasis vaginitis. This is a sexually transmitted disease (STD).  Viral vaginitis.  Atrophic vaginitis.  Allergic vaginitis. What are the causes? The cause of this condition depends on the type of vaginitis. It can be caused by:  Bacteria (bacterial vaginosis).  Yeast, which is a fungus (yeast infection).  A parasite (trichomoniasis vaginitis).  A virus (viral vaginitis).  Low hormone levels (atrophic vaginitis). Low hormone levels can occur during pregnancy, breastfeeding, or after menopause.  Irritants, such as bubble baths, scented tampons, and feminine sprays (allergic vaginitis). Other factors can change the normal balance of the yeast and bacteria that live in the vagina. These include:  Antibiotic medicines.  Poor hygiene.  Diaphragms, vaginal sponges, spermicides, birth control pills, and intrauterine devices (IUD).  Sex.  Infection.  Uncontrolled diabetes.  A weakened defense (immune) system. What increases the risk? This condition is more likely to develop in women who:  Smoke.  Use vaginal douches, scented tampons, or scented sanitary pads.  Wear tight-fitting pants.  Wear thong underwear.  Use oral birth control pills or an IUD.  Have sex without a condom.  Have multiple sex partners.  Have an STD.  Frequently use the spermicide [id number]-9.  Eat lots of foods high in sugar.  Have uncontrolled diabetes.  Have low estrogen levels.  Have a weakened immune system from an immune disorder or medical  treatment.  Are pregnant or breastfeeding. What are the signs or symptoms? Symptoms vary depending on the cause of the vaginitis. Common symptoms include:  Abnormal vaginal discharge. ? The discharge is white, gray, or yellow with bacterial vaginosis. ? The discharge is thick, white, and cheesy with a yeast infection. ? The discharge is frothy and yellow or greenish with trichomoniasis.  A bad vaginal smell. The smell is fishy with bacterial vaginosis.  Vaginal itching, pain, or swelling.  Sex that is painful.  Pain or burning when urinating. Sometimes there are no symptoms. How is this diagnosed? This condition is diagnosed based on your symptoms and medical history. A physical exam, including a pelvic exam, will also be done. You may also have other tests, including:  Tests to determine the pH level (acidity or alkalinity) of your vagina.  A whiff test, to assess the odor that results when a sample of your vaginal discharge is mixed with a potassium hydroxide solution.  Tests of vaginal fluid. A sample will be examined under a microscope. How is this treated? Treatment varies depending on the type of vaginitis you have. Your treatment may include:  Antibiotic creams or pills to treat bacterial vaginosis and trichomoniasis.  Antifungal medicines, such as vaginal creams or suppositories, to treat a yeast infection.  Medicine to ease discomfort if you have viral vaginitis. Your sexual partner should also be treated.  Estrogen delivered in a cream, pill, suppository, or vaginal ring to treat atrophic vaginitis. If vaginal dryness occurs, lubricants and moisturizing creams may help. You may need to avoid scented soaps, sprays, or douches.  Stopping use of a product that is causing allergic vaginitis. Then using a vaginal cream to treat the symptoms. Follow   these instructions at home: Lifestyle  Keep your genital area clean and dry. Avoid soap, and only rinse the area with  water.  Do not douche or use tampons until your health care provider says it is okay to do so. Use sanitary pads, if needed.  Do not have sex until your health care provider approves. When you can return to sex, practice safe sex and use condoms.  Wipe from front to back. This avoids the spread of bacteria from the rectum to the vagina. General instructions  Take over-the-counter and prescription medicines only as told by your health care provider.  If you were prescribed an antibiotic medicine, take or use it as told by your health care provider. Do not stop taking or using the antibiotic even if you start to feel better.  Keep all follow-up visits as told by your health care provider. This is important. How is this prevented?  Use mild, non-scented products. Do not use things that can irritate the vagina, such as fabric softeners. Avoid the following products if they are scented: ? Feminine sprays. ? Detergents. ? Tampons. ? Feminine hygiene products. ? Soaps or bubble baths.  Let air reach your genital area. ? Wear cotton underwear to reduce moisture buildup. ? Avoid wearing underwear while you sleep. ? Avoid wearing tight pants and underwear or nylons without a cotton panel. ? Avoid wearing thong underwear.  Take off any wet clothing, such as bathing suits, as soon as possible.  Practice safe sex and use condoms. Contact a health care provider if:  You have abdominal pain.  You have a fever.  You have symptoms that last for more than 2-3 days. Get help right away if:  You have a fever and your symptoms suddenly get worse. Summary  Vaginitis is a condition in which the vaginal tissue becomes inflamed.This condition is most often caused by a change in the normal balance of bacteria and yeast that live in the vagina.  Treatment varies depending on the type of vaginitis you have.  Do not douche, use tampons , or have sex until your health care provider approves. When  you can return to sex, practice safe sex and use condoms. This information is not intended to replace advice given to you by your health care provider. Make sure you discuss any questions you have with your health care provider. Document Revised: 03/15/2017 Document Reviewed: 05/08/2016 [employer]vier Patient Education  2020 [employer]  

## 2019-12-22 NOTE — Progress Notes (Signed)
GYN ENCOUNTER NOTE  Subjective:       Leah Thompson is a 44 y.o. G44P0011 female is here for gynecologic evaluation of the following issues:  1. Has history of yeast infections , especially as a teen. Has had increased vaginal discharge with itching. She sates she had intercourse with her partner on Friday, went to the pool all day on Saturday , then had intercourse again on Sunday. She states that she has treated with diflucan, monistat. State it starts to get better then returns. .     Gynecologic History Patient's last menstrual period was 12/01/2019 (exact date). Contraception: none Last Pap: 03/2019. Results were: normal Last mammogram: ordered has not had done  Obstetric History OB History  Gravida Para Term Preterm AB Living  2       1 1   SAB TAB Ectopic Multiple Live Births  1       1    # Outcome Date GA Lbr Len/2nd Weight Sex Delivery Anes PTL Lv  2 Gravida 02/05/14    F CS-Unspec   LIV  1 SAB             Past Medical History:  Diagnosis Date  . Anxiety   . Headache    migraines  . HELLP (hemolytic anemia/elev liver enzymes/low platelets in pregnancy)   . HELLP (hemolytic anemia/elev liver enzymes/low platelets in pregnancy)   . Hypothyroidism    nodule  . Preeclampsia     Past Surgical History:  Procedure Laterality Date  . BREAST SURGERY     augmentation  . CESAREAN SECTION  02/05/14   HELLP syndrome  . THYROID LOBECTOMY Right 03/01/2017   Procedure: RIGHT THYROID LOBECTOMY;  Surgeon: Armandina Gemma, MD;  Location: [facility];  Service: General;  Laterality: Right;  . THYROIDECTOMY N/A 03/25/2017   Procedure: COMPLETION THYROIDECTOMY;  Surgeon: Armandina Gemma, MD;  Location: [facility];  Service: General;  Laterality: N/A;  . TONSILLECTOMY  2005    Current Outpatient Medications on File Prior to Visit  Medication Sig Dispense Refill  . Biotin w/ Vitamins C & E (HAIR/SKIN/NAILS PO) Take 1 capsule by mouth 4 (four) times a week.     Marland Kitchen buPROPion (WELLBUTRIN  XL) 150 MG 24 hr tablet Take by mouth.    Marland Kitchen buPROPion (WELLBUTRIN XL) 300 MG 24 hr tablet Take 300 mg by mouth daily.    Marland Kitchen levothyroxine (SYNTHROID) 150 MCG tablet Take 150 mcg by mouth daily before breakfast.    . LORazepam (ATIVAN) 1 MG tablet Take 1 tablet (1 mg total) by mouth 3 (three) times daily. 90 tablet 0  . Vitamin D, Ergocalciferol, (DRISDOL) 1.25 MG (50000 UNIT) CAPS capsule Take 1 capsule (50,000 Units total) by mouth every 7 (seven) days. 30 capsule 3   No current facility-administered medications on file prior to visit.    No Known Allergies  Social History   Socioeconomic History  . Marital status: Married    Spouse name: Not on file  . Number of children: Not on file  . Years of education: Not on file  . Highest education level: Not on file  Occupational History  . Not on file  Tobacco Use  . Smoking status: Former Research scientist (life sciences)  . Smokeless tobacco: Never Used  . Tobacco comment: hx of socially  Vaping Use  . Vaping Use: Never used  Substance and Sexual Activity  . Alcohol use: No    Alcohol/week: 0.0 standard drinks    Comment: social  . Drug  use: No  . Sexual activity: Yes    Birth control/protection: Condom  Other Topics Concern  . Not on file  Social History Narrative  . Not on file   Social Determinants of Health   Financial Resource Strain:   . Difficulty of Paying Living Expenses: Not on file  Food Insecurity:   . Worried About Charity fundraiser in the Last Year: Not on file  . Ran Out of Food in the Last Year: Not on file  Transportation Needs:   . Lack of Transportation (Medical): Not on file  . Lack of Transportation (Non-Medical): Not on file  Physical Activity:   . Days of Exercise per Week: Not on file  . Minutes of Exercise per Session: Not on file  Stress:   . Feeling of Stress : Not on file  Social Connections:   . Frequency of Communication with Friends and Family: Not on file  . Frequency of Social Gatherings with Friends and  Family: Not on file  . Attends Religious Services: Not on file  . Active Member of Clubs or Organizations: Not on file  . Attends Archivist Meetings: Not on file  . Marital Status: Not on file  Intimate Partner Violence:   . Fear of Current or Ex-Partner: Not on file  . Emotionally Abused: Not on file  . Physically Abused: Not on file  . Sexually Abused: Not on file    Family History  Problem Relation Age of Onset  . Heart disease Father     The following portions of the patient's history were reviewed and updated as appropriate: allergies, current medications, past family history, past medical history, past social history, past surgical history and problem list.  Review of Systems Review of Systems - Negative except as menioned in HPI Review of Systems - General ROS: negative for - chills, fatigue, fever, hot flashes, malaise or night sweats Hematological and Lymphatic ROS: negative for - bleeding problems or swollen lymph nodes Gastrointestinal ROS: negative for - abdominal pain, blood in stools, change in bowel habits and nausea/vomiting Musculoskeletal ROS: negative for - joint pain, muscle pain or muscular weakness Genito-Urinary ROS: negative for - change in menstrual cycle, dysmenorrhea, dyspareunia, dysuria, genital discharge, genital ulcers, hematuria, incontinence, irregular/heavy menses, nocturia or pelvic painjj  Objective:   BP 102/71   Pulse 89   Ht 5\' 5"  (1.651 m)   Wt 150 lb 3.2 oz (68.1 kg)   LMP 12/01/2019 (Exact Date)   BMI 24.99 kg/m  CONSTITUTIONAL: Well-developed, well-nourished female in no acute distress.  HENT:  Normocephalic, atraumatic.  NECK: Normal range of motion, supple, no masses.  Normal thyroid.  SKIN: Skin is warm and dry. No rash noted. Not diaphoretic. No erythema. No pallor. [place]: Alert and oriented to person, place, and time. PSYCHIATRIC: Normal mood and affect. Normal behavior. Normal judgment and thought  content. CARDIOVASCULAR:Not Examined RESPIRATORY: Not Examined BREASTS: Not Examined ABDOMEN: Soft, non distended; Non tender.  No Organomegaly. PELVIC:  External Genitalia: Normal  BUS: Normal  Vagina: Normal, white particulate discharge noted  Cervix: Normal  MUSCULOSKELETAL: Normal range of motion. No tenderness.  No cyanosis, clubbing, or edema.     Assessment:   Vaginitis    Plan:   Discussed resistant yeast, encouraged use of boric acid vaginally suppository, sample given. Swab collected, orders placed for diflucan. Discussed use of over the next 6 months, 1 tablet q 72 hrs x 3 then once a wk for 6 months. She will let  me know if it does not result to order the diflucan for the next 6 month. Discussed perimenopause and vaginal changes that include itching and treatment with local estrogen replacement. She verbalizes understanding and agrees to plan. Follow prn or for annual exam.   Philip Aspen, CNM

## 2019-12-25 NOTE — Telephone Encounter (Signed)
Spoke with patient. She was able to see her PCP.

## 2019-12-27 LAB — NUSWAB VG+, CANDIDA 6SP
C PARAPSILOSIS/TROPICALIS: NEGATIVE
Candida albicans, NAA: NEGATIVE
Candida glabrata, NAA: NEGATIVE
Candida krusei, NAA: NEGATIVE
Candida lusitaniae, NAA: NEGATIVE
Chlamydia trachomatis, NAA: NEGATIVE
Neisseria gonorrhoeae, NAA: NEGATIVE
Trich vag by NAA: NEGATIVE

## 2020-01-06 ENCOUNTER — Other Ambulatory Visit: Payer: Self-pay | Admitting: Certified Nurse Midwife

## 2020-01-06 DIAGNOSIS — N926 Irregular menstruation, unspecified: Secondary | ICD-10-CM

## 2020-02-01 ENCOUNTER — Other Ambulatory Visit: Payer: Self-pay | Admitting: *Deleted

## 2020-02-01 MED ORDER — LEVOTHYROXINE SODIUM 150 MCG PO TABS: 150.0000 ug | ORAL_TABLET | Freq: Every day | ORAL | 3 refills | Status: DC

## 2020-02-24 DIAGNOSIS — F411 Generalized anxiety disorder: Secondary | ICD-10-CM | POA: Insufficient documentation

## 2020-05-05 NOTE — Progress Notes (Unsigned)
This encounter was created in error - please disregard.

## 2020-05-06 ENCOUNTER — Encounter: Payer: 59 | Admitting: Endocrinology

## 2020-05-06 DIAGNOSIS — C73 Malignant neoplasm of thyroid gland: Secondary | ICD-10-CM

## 2020-06-08 ENCOUNTER — Other Ambulatory Visit: Payer: Self-pay

## 2020-06-08 MED ORDER — FLUCONAZOLE 150 MG PO TABS: 150.0000 mg | ORAL_TABLET | Freq: Every day | ORAL | 0 refills | Status: DC

## 2020-07-05 ENCOUNTER — Encounter: Payer: 59 | Admitting: Certified Nurse Midwife

## 2020-07-20 ENCOUNTER — Other Ambulatory Visit: Payer: Self-pay | Admitting: Endocrinology

## 2020-08-24 ENCOUNTER — Other Ambulatory Visit: Payer: Self-pay | Admitting: Certified Nurse Midwife

## 2020-08-24 DIAGNOSIS — N6489 Other specified disorders of breast: Secondary | ICD-10-CM

## 2020-10-14 DIAGNOSIS — S82832A Other fracture of upper and lower end of left fibula, initial encounter for closed fracture: Secondary | ICD-10-CM | POA: Insufficient documentation

## 2021-02-04 ENCOUNTER — Other Ambulatory Visit: Payer: Self-pay

## 2021-02-16 ENCOUNTER — Other Ambulatory Visit: Payer: Self-pay

## 2021-02-16 DIAGNOSIS — N898 Other specified noninflammatory disorders of vagina: Secondary | ICD-10-CM

## 2021-02-16 MED ORDER — FLUCONAZOLE 150 MG PO TABS: 150.0000 mg | ORAL_TABLET | Freq: Once | ORAL | 0 refills | Status: AC

## 2021-02-16 MED ORDER — FLUCONAZOLE 150 MG PO TABS: 150.0000 mg | ORAL_TABLET | Freq: Every day | ORAL | 0 refills | Status: DC

## 2021-03-06 ENCOUNTER — Other Ambulatory Visit: Payer: Self-pay | Admitting: Certified Nurse Midwife

## 2021-03-06 ENCOUNTER — Encounter: Payer: Self-pay | Admitting: Certified Nurse Midwife

## 2021-03-06 DIAGNOSIS — N6310 Unspecified lump in the right breast, unspecified quadrant: Secondary | ICD-10-CM

## 2021-03-06 NOTE — Progress Notes (Signed)
i

## 2021-03-13 ENCOUNTER — Encounter: Payer: Self-pay | Admitting: Certified Nurse Midwife

## 2021-03-17 ENCOUNTER — Other Ambulatory Visit (INDEPENDENT_AMBULATORY_CARE_PROVIDER_SITE_OTHER): Payer: 59

## 2021-03-17 ENCOUNTER — Other Ambulatory Visit: Payer: Self-pay

## 2021-03-17 DIAGNOSIS — C73 Malignant neoplasm of thyroid gland: Secondary | ICD-10-CM | POA: Diagnosis not present

## 2021-03-21 ENCOUNTER — Other Ambulatory Visit: Payer: Self-pay | Admitting: Endocrinology

## 2021-03-21 ENCOUNTER — Other Ambulatory Visit: Payer: Self-pay

## 2021-03-21 DIAGNOSIS — E890 Postprocedural hypothyroidism: Secondary | ICD-10-CM

## 2021-03-21 LAB — THYROGLOBULIN BY IMA: Thyroglobulin by IMA: 0.1 ng/mL — ABNORMAL LOW (ref 1.5–38.5)

## 2021-03-21 LAB — TGAB+THYROGLOBULIN IMA OR LCMS: Thyroglobulin Antibody: <1 IU/mL (ref 0.0–0.9)

## 2021-03-22 ENCOUNTER — Encounter: Payer: Self-pay | Admitting: Certified Nurse Midwife

## 2021-03-24 ENCOUNTER — Other Ambulatory Visit: Payer: Self-pay

## 2021-03-24 ENCOUNTER — Encounter: Payer: Self-pay | Admitting: Endocrinology

## 2021-03-24 ENCOUNTER — Ambulatory Visit (INDEPENDENT_AMBULATORY_CARE_PROVIDER_SITE_OTHER): Payer: 59 | Admitting: Endocrinology

## 2021-03-24 ENCOUNTER — Other Ambulatory Visit: Payer: Self-pay | Admitting: Certified Nurse Midwife

## 2021-03-24 VITALS — BP 126/90 | HR 80 | Ht 65.0 in | Wt 162.6 lb

## 2021-03-24 DIAGNOSIS — E890 Postprocedural hypothyroidism: Secondary | ICD-10-CM

## 2021-03-24 DIAGNOSIS — C73 Malignant neoplasm of thyroid gland: Secondary | ICD-10-CM

## 2021-03-24 DIAGNOSIS — Z23 Encounter for immunization: Secondary | ICD-10-CM | POA: Diagnosis not present

## 2021-03-24 DIAGNOSIS — R5383 Other fatigue: Secondary | ICD-10-CM | POA: Diagnosis not present

## 2021-03-24 DIAGNOSIS — N6489 Other specified disorders of breast: Secondary | ICD-10-CM

## 2021-03-24 NOTE — Progress Notes (Signed)
Patient ID: Leah Thompson, female   DOB: 1976/04/16, 45 y.o.   MRN: [id number]           Reason for Appointment: Follow-up of thyroid    History of Present Illness:   HYPOTHYROIDISM, postsurgical:  Her levothyroxine dose previously was 100 mcg daily When her TSH is higher her doses have been progressively increased  She has been irregular with her follow-up and has not been seen since 10/2019  She has had continued fatigue which she thinks is more in the last few months but probably present for 1 to 2 years She think this is related to her thyroid surgery but this was back in 2018 She does not think this is related to a previous COVID infection  She has gained weight but this may be partly because of having a lower extremity fracture this summer  She tends to feel more warm but has no cold intolerance No skin or hair changes although may be losing a little hair from the vertex  She is very regular with taking her levothyroxine before breakfast with water  She takes her vitamins at night, also taking biotin currently  Her thyroxine was last increased when her TSH was 5.7 done by PCP on 10/09/2019  She continues to take 150 mcg levothyroxine, she still gets her medication from [facility]   Thyroid labs as follows:  Lab Results  Component Value Date   TSH 2.850 03/17/2021   TSH 2.19 12/11/2019   TSH 1.61 07/24/2019   FREET4 0.96 12/11/2019   FREET4 0.80 07/24/2019   FREET4 1.29 05/01/2019    Lab Results  Component Value Date   TSH 2.850 03/17/2021   TSH 2.19 12/11/2019   TSH 1.61 07/24/2019   TSH 0.50 05/01/2019   TSH 0.32 (L) 11/21/2018    Wt Readings from Last 3 Encounters:  03/24/21 162 lb 9.6 oz (73.8 kg)  12/22/19 150 lb 3.2 oz (68.1 kg)  10/30/19 151 lb 12.8 oz (68.9 kg)    Lab Results  Component Value Date   CALCIUM 9.1 05/01/2019    THYROID cancer:  The patient's thyroid nodule was first discovered by herself in 5/17 when she felt her neck      The thyroid ultrasound showed the following: Dominant solid minimally hypoechoic nodule with some internal cystic degeneration in the right mid gland measures 3.7 x 1.9 x 2.2 cm.  A smaller hypoechoic solid nodule in the upper gland measures 1.0 x 0.6 x 0.8 cm. Needle aspiration of this nodule showed benign follicular adenoma. 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 with another focus of 0.6 cm in the right side No lymph nodes were involved and the tumor was encapsulated with invasion into but not through the capsule  She was treated with I-131 and 09/2017 with 47 mCi which she tolerated well Follow-up scan showed only activity in the thyroid bed  Thyroglobulin levels have been consistently undetectable as follows  Lab Results  Component Value  Date   THGAB <1.0 03/17/2021   THGAB <1.0 07/24/2019   THGAB <1.0 11/21/2018   THGAB <1.0 06/18/2018      Allergies as of 03/24/2021   No Known Allergies      Medication List        Accurate as of March 24, 2021 11:20 AM. If you have any questions, ask your nurse or doctor.          buPROPion 150 MG 24 hr tablet Commonly known as: WELLBUTRIN XL Take by mouth.   buPROPion 300 MG 24 hr tablet Commonly known as: WELLBUTRIN XL Take 300 mg by mouth daily.   fluconazole 150 MG tablet Commonly known as: DIFLUCAN Take 1 tablet (150 mg total) by mouth every 3 (three) days. May take q 72 hrs for resistant yeast infection   fluconazole  150 MG tablet Commonly known as: Diflucan Take 1 tablet (150 mg total) by mouth daily.   HAIR/SKIN/NAILS PO Take 1 capsule by mouth 4 (four) times a week.   levothyroxine 150 MCG tablet Commonly known as: SYNTHROID TAKE 1 TABLET BY MOUTH DAILY BEFORE BREAKFAST.   LORazepam 1 MG tablet Commonly known as: ATIVAN Take 1 tablet (1 mg total) by mouth 3 (three) times daily.   Vitamin D (Ergocalciferol) 1.25 MG (50000 UNIT) Caps capsule Commonly known as: DRISDOL Take 1 capsule (50,000 Units total) by mouth every 7 (seven) days.        Allergies: No Known Allergies  Past Medical History:  Diagnosis Date   Anxiety    Headache    migraines   HELLP (hemolytic anemia/elev liver enzymes/low platelets in pregnancy)    HELLP (hemolytic anemia/elev liver enzymes/low platelets in pregnancy)    Hypothyroidism    nodule   Preeclampsia      Past Surgical History:  Procedure Laterality Date   BREAST SURGERY     augmentation   CESAREAN SECTION  02/05/14   HELLP syndrome   THYROID LOBECTOMY Right 03/01/2017   Procedure: RIGHT THYROID LOBECTOMY;  Surgeon: Armandina Gemma, MD;  Location: [facility];  Service: General;  Laterality: Right;   THYROIDECTOMY N/A 03/25/2017   Procedure: COMPLETION THYROIDECTOMY;  Surgeon: Armandina Gemma, MD;  Location: [facility];  Service: General;  Laterality: N/A;   TONSILLECTOMY  2005    Family History  Problem Relation Age of Onset   Heart disease Father    Hypertension Father    Diabetes Father     Social History:  reports that she has quit smoking. She has never used smokeless tobacco. She reports current alcohol use of about 3.0 standard drinks per week. She reports that she does not use drugs.    Review of Systems:   Vitamin D deficiency: Supplemented by her gynecologist with vitamin D, baseline level 27 She was prescribed 50,000 units weekly but not taking this currently and is due for follow-up Last level was 61 as of June 2021  Lab Results   Component Value Date   VD25OH 33.01 07/24/2019   VD25OH 62.74 11/21/2018   VD25OH 26.7 (L) 06/18/2018   Lab Results  Component Value Date   CALCIUM 9.1 05/01/2019    Hair loss: This is not any different recently    Examination:   BP 126/90 (BP Location: Left Arm, Patient Position: Sitting, Cuff Size: Normal)   Pulse 80   Ht 5\' 5"  (1.651 m)   Wt 162 lb 9.6 oz (73.8 kg)   SpO2 99%   BMI 27.06 kg/m  Her neck exam is normal, no thyroid enlargement or lymphadenopathy   Assessment/Plan:   HYPOTHYROIDISM:  She has postsurgical hypothyroidism  She has increasing fatigue despite normal thyroid levels This is more in the last few months Again continues to have other issues including anxiety and depression although insomnia is better with trazodone  Has been continued on 150 mcg levothyroxine for some time Has been regular with this and TSH is again normal [facility]  Thyroid cancer, follicular variant of papillary carcinoma She has had a 4 cm and a 0.6 cm tumor on the right After I-131 treatment her thyroglobulin level has been consistently undetectable as of 12/22 Neck exam again normal  FATIGUE: Discussed that she needs to discuss it with her PCP and gynecologist and not related to endocrine causes  Elayne Snare 03/24/2021

## 2021-03-25 NOTE — Addendum Note (Signed)
Addended by: Elayne Snare on: 03/25/2021 11:17 AM   Modules accepted: Orders

## 2021-03-29 LAB — SPECIMEN STATUS REPORT

## 2021-03-29 LAB — TSH: TSH: 2.85 u[IU]/mL (ref 0.450–4.500)

## 2021-04-04 ENCOUNTER — Other Ambulatory Visit: Payer: Self-pay | Admitting: Endocrinology

## 2021-04-04 ENCOUNTER — Encounter: Payer: Self-pay | Admitting: Endocrinology

## 2021-04-06 NOTE — Telephone Encounter (Signed)
Faxed this about a week ago. You can reprint the order and fax again if they did not get it.

## 2021-04-18 ENCOUNTER — Encounter: Payer: Self-pay | Admitting: Certified Nurse Midwife

## 2021-04-18 ENCOUNTER — Other Ambulatory Visit: Payer: Self-pay | Admitting: Certified Nurse Midwife

## 2021-04-18 DIAGNOSIS — R928 Other abnormal and inconclusive findings on diagnostic imaging of breast: Secondary | ICD-10-CM

## 2021-04-18 DIAGNOSIS — N6310 Unspecified lump in the right breast, unspecified quadrant: Secondary | ICD-10-CM

## 2021-05-08 ENCOUNTER — Ambulatory Visit (INDEPENDENT_AMBULATORY_CARE_PROVIDER_SITE_OTHER): Payer: 59 | Admitting: Certified Nurse Midwife

## 2021-05-08 ENCOUNTER — Other Ambulatory Visit: Payer: Self-pay

## 2021-05-08 ENCOUNTER — Other Ambulatory Visit (HOSPITAL_COMMUNITY)
Admission: RE | Admit: 2021-05-08 | Discharge: 2021-05-08 | Disposition: A | Discharge status: Home / Self Care | Payer: 59 | Source: Ambulatory Visit | Attending: Certified Nurse Midwife | Admitting: Certified Nurse Midwife

## 2021-05-08 ENCOUNTER — Encounter: Payer: Self-pay | Admitting: Certified Nurse Midwife

## 2021-05-08 VITALS — BP 109/74 | HR 76 | Ht 65.0 in | Wt 174.8 lb

## 2021-05-08 DIAGNOSIS — Z124 Encounter for screening for malignant neoplasm of cervix: Secondary | ICD-10-CM | POA: Insufficient documentation

## 2021-05-08 DIAGNOSIS — Z13220 Encounter for screening for lipoid disorders: Secondary | ICD-10-CM | POA: Diagnosis not present

## 2021-05-08 DIAGNOSIS — Z01419 Encounter for gynecological examination (general) (routine) without abnormal findings: Secondary | ICD-10-CM | POA: Diagnosis present

## 2021-05-08 DIAGNOSIS — Z113 Encounter for screening for infections with a predominantly sexual mode of transmission: Secondary | ICD-10-CM

## 2021-05-08 DIAGNOSIS — E669 Obesity, unspecified: Secondary | ICD-10-CM | POA: Insufficient documentation

## 2021-05-08 NOTE — Progress Notes (Signed)
GYNECOLOGY ANNUAL PREVENTATIVE CARE ENCOUNTER NOTE  History:     Leah Thompson is a 46 y.o. G21P0011 female here for a routine annual gynecologic exam.  Current complaints: concerns about weight.   Denies abnormal vaginal bleeding, discharge, pelvic pain, problems with intercourse or other gynecologic concerns.     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   Gynecologic History Patient's last menstrual period was 05/07/2021 (exact date). Contraception: none Last Pap: 04/14/19. Results were: normal with negative HPV Last mammogram:  08/19/2020. Results were: normal   Upstream - 05/08/21 0981       Pregnancy Intention Screening   Does the patient want to become pregnant in the next year? No    Does the patient's partner want to become pregnant in the next year? No    Would the patient like to discuss contraceptive options today? N/A            The pregnancy intention screening data noted above was reviewed. Potential methods of contraception were discussed. The patient elected to proceed with NFP.   Obstetric History OB History  Gravida Para Term Preterm AB Living  2       1 1   SAB IAB Ectopic Multiple Live Births  1       1    # Outcome Date GA Lbr Len/2nd Weight Sex Delivery Anes PTL Lv  2 Gravida 02/05/14    F CS-Unspec   LIV  1 SAB             Past Medical History:  Diagnosis Date   Anxiety    Headache    migraines   HELLP (hemolytic anemia/elev liver enzymes/low platelets in pregnancy)    HELLP (hemolytic anemia/elev liver enzymes/low platelets in pregnancy)    Hypothyroidism    nodule   Preeclampsia     Past Surgical History:  Procedure Laterality Date   BREAST SURGERY     augmentation   CESAREAN SECTION  02/05/14   HELLP syndrome   THYROID LOBECTOMY  Right 03/01/2017   Procedure: RIGHT THYROID LOBECTOMY;  Surgeon: Armandina Gemma, MD;  Location: [facility];  Service: General;  Laterality: Right;   THYROIDECTOMY N/A 03/25/2017   Procedure: COMPLETION THYROIDECTOMY;  Surgeon: Armandina Gemma, MD;  Location: [facility];  Service: General;  Laterality: N/A;   TONSILLECTOMY  2005    Current Outpatient Medications on File Prior to Visit  Medication Sig Dispense Refill   Biotin w/ Vitamins C & E (HAIR/SKIN/NAILS PO) Take 1 capsule by mouth 4 (four) times a week.      buPROPion (WELLBUTRIN XL) 300 MG 24 hr tablet Take 300 mg by mouth daily.     fluconazole (DIFLUCAN) 150 MG tablet Take 1 tablet (150 mg total) by mouth every 3 (three) days. May take q 72 hrs for resistant yeast infection 3 tablet 0   fluconazole (DIFLUCAN) 150 MG tablet Take 1 tablet (150 mg total) by mouth daily. 1 tablet 0   levothyroxine (SYNTHROID) 150 MCG tablet TAKE ONE TABLET BY MOUTH BEFORE BREAKFAST 90 tablet 1   LORazepam (ATIVAN) 1 MG tablet Take 1 tablet (1 mg total) by mouth 3 (three) times daily. 90 tablet 0   Vitamin D, Ergocalciferol, (DRISDOL) 1.25 MG (50000 UNIT) CAPS capsule Take 1 capsule (50,000 Units total) by mouth every 7 (seven) days. [place]  capsule 3   buPROPion (WELLBUTRIN XL) 150 MG 24 hr tablet Take by mouth.     No current facility-administered medications on file prior to visit.    No Known Allergies  Social History:  reports that she has quit smoking. She has never used smokeless tobacco. She reports current alcohol use of about 3.0 standard drinks per week. She reports that she does not use drugs.  Family History  Problem Relation Age of Onset   Heart disease Father    Hypertension Father    Diabetes Father     The following portions of the patient's history were reviewed and updated as appropriate: allergies, current medications, past family history, past medical history, past social history, past surgical history and problem list.  Review of  Systems Pertinent items noted in HPI and remainder of comprehensive ROS otherwise negative.  Physical Exam:  BP 109/74    Pulse 76    Ht 5\' 5"  (1.651 m)    Wt 174 lb 12.8 oz (79.3 kg)    LMP 05/07/2021 (Exact Date)    BMI 29.09 kg/m  CONSTITUTIONAL: Well-developed, well-nourished female in no acute distress.  HENT:  Normocephalic, atraumatic, External right and left ear normal. Oropharynx is clear and moist EYES: Conjunctivae and EOM are normal. Pupils are equal, round, and reactive to light. No scleral icterus.  NECK: Normal range of motion, supple, no masses.  Normal thyroid.  SKIN: Skin is warm and dry. No rash noted. Not diaphoretic. No erythema. No pallor. MUSCULOSKELETAL: Normal range of motion. No tenderness.  No cyanosis, clubbing, or edema.  2+ distal pulses. NEUROLOGIC: Alert and oriented to person, place, and time. Normal reflexes, muscle tone coordination.  PSYCHIATRIC: Normal mood and affect. Normal behavior. Normal judgment and thought content. CARDIOVASCULAR: Normal heart rate noted, regular rhythm RESPIRATORY: Clear to auscultation bilaterally. Effort and breath sounds normal, no problems with respiration noted. BREASTS: Symmetric in size. No masses, tenderness, skin changes, nipple drainage, or lymphadenopathy bilaterally.  ABDOMEN: Soft, no distention noted.  No tenderness, rebound or guarding.  PELVIC: Normal appearing external genitalia and urethral meatus; normal appearing vaginal mucosa and cervix.  No abnormal discharge noted.  Pap smear obtained.  Normal uterine size, no other palpable masses, no uterine or adnexal tenderness.  .   Assessment and Plan:    1. Well woman exam with routine gynecological exam   Pap:  Will follow up results of pap smear and manage accordingly. Mammogram : ordered Labs: Hep c, HIV, Cholesterol  Refills: none Referral: none Routine preventative health maintenance measures emphasized. Please refer to After Visit Summary for other  counseling recommendations.      Philip Aspen, CNM [facility]

## 2021-05-09 ENCOUNTER — Encounter: Payer: Self-pay | Admitting: Certified Nurse Midwife

## 2021-05-09 ENCOUNTER — Encounter: Payer: Self-pay | Admitting: Endocrinology

## 2021-05-09 ENCOUNTER — Other Ambulatory Visit: Payer: Self-pay | Admitting: Certified Nurse Midwife

## 2021-05-09 DIAGNOSIS — E7800 Pure hypercholesterolemia, unspecified: Secondary | ICD-10-CM

## 2021-05-09 LAB — CERVICOVAGINAL ANCILLARY ONLY
Bacterial Vaginitis (gardnerella): NEGATIVE
Candida Glabrata: NEGATIVE
Candida Vaginitis: NEGATIVE
Chlamydia: NEGATIVE
Comment: NEGATIVE
Comment: NEGATIVE
Comment: NEGATIVE
Comment: NEGATIVE
Comment: NEGATIVE
Comment: NORMAL
Neisseria Gonorrhea: NEGATIVE
Trichomonas: NEGATIVE

## 2021-05-09 LAB — LIPID PANEL
Chol/HDL Ratio: 3.1 ratio (ref 0.0–4.4)
Cholesterol, Total: 236 mg/dL — ABNORMAL HIGH (ref 100–199)
HDL: 77 mg/dL (ref >39)
LDL Chol Calc (NIH): 137 mg/dL — ABNORMAL HIGH (ref 0–99)
Triglycerides: 127 mg/dL (ref 0–149)
VLDL Cholesterol Cal: 22 mg/dL (ref 5–40)

## 2021-05-09 LAB — HEPATITIS C ANTIBODY: Hep C Virus Ab: <0.1 s/co ratio (ref 0.0–0.9)

## 2021-05-09 LAB — HIV ANTIBODY (ROUTINE TESTING W REFLEX): HIV Screen 4th Generation wRfx: NONREACTIVE

## 2021-05-11 LAB — CYTOLOGY - PAP: Diagnosis: NEGATIVE

## 2021-07-16 ENCOUNTER — Other Ambulatory Visit: Payer: Self-pay | Admitting: Certified Nurse Midwife

## 2021-11-22 ENCOUNTER — Encounter (INDEPENDENT_AMBULATORY_CARE_PROVIDER_SITE_OTHER): Payer: Self-pay

## 2021-11-23 NOTE — Telephone Encounter (Signed)
Please help pt

## 2021-11-30 ENCOUNTER — Ambulatory Visit (INDEPENDENT_AMBULATORY_CARE_PROVIDER_SITE_OTHER): Payer: Self-pay | Admitting: Family Medicine

## 2021-11-30 ENCOUNTER — Encounter (INDEPENDENT_AMBULATORY_CARE_PROVIDER_SITE_OTHER): Payer: Self-pay

## 2021-12-08 DIAGNOSIS — E782 Mixed hyperlipidemia: Secondary | ICD-10-CM | POA: Insufficient documentation

## 2021-12-14 ENCOUNTER — Ambulatory Visit (INDEPENDENT_AMBULATORY_CARE_PROVIDER_SITE_OTHER): Payer: Self-pay | Admitting: Family Medicine

## 2021-12-20 ENCOUNTER — Encounter: Payer: Self-pay | Admitting: Certified Nurse Midwife

## 2021-12-21 ENCOUNTER — Other Ambulatory Visit: Payer: Self-pay | Admitting: Certified Nurse Midwife

## 2021-12-21 DIAGNOSIS — Z87898 Personal history of other specified conditions: Secondary | ICD-10-CM

## 2022-01-03 ENCOUNTER — Other Ambulatory Visit: Payer: Self-pay | Admitting: Endocrinology

## 2022-01-05 ENCOUNTER — Encounter: Payer: Self-pay | Admitting: Certified Nurse Midwife

## 2022-01-15 ENCOUNTER — Encounter: Payer: Self-pay | Admitting: Endocrinology

## 2022-01-16 ENCOUNTER — Encounter: Payer: Self-pay | Admitting: Endocrinology

## 2022-01-18 ENCOUNTER — Telehealth: Payer: 59 | Admitting: Endocrinology

## 2022-01-19 ENCOUNTER — Telehealth (INDEPENDENT_AMBULATORY_CARE_PROVIDER_SITE_OTHER): Payer: 59 | Admitting: Endocrinology

## 2022-01-19 ENCOUNTER — Encounter: Payer: Self-pay | Admitting: Endocrinology

## 2022-01-19 DIAGNOSIS — E890 Postprocedural hypothyroidism: Secondary | ICD-10-CM

## 2022-01-19 DIAGNOSIS — R5383 Other fatigue: Secondary | ICD-10-CM

## 2022-01-19 MED ORDER — LEVOTHYROXINE SODIUM 175 MCG PO TABS: 175.0000 ug | ORAL_TABLET | Freq: Every day | ORAL | 3 refills | Status: DC

## 2022-01-19 NOTE — Progress Notes (Signed)
Patient ID: Olivia Canter, female   DOB: Mar 12, 1976, 46 y.o.   MRN: [id number]           Reason for Appointment: Follow-up of thyroid  I connected with the above-named patient by video enabled telemedicine application and verified that I am speaking with the correct person. The patient was explained the limitations of evaluation and management by telemedicine and the availability of in person appointments.  Patient also understood that there may be a patient responsible charge related to this service  Location of the patient: Patient's home  Location of the provider: Physician office Only the patient and myself were participating in the encounter The patient understood the above statements and agreed to proceed.   History of Present Illness:   HYPOTHYROIDISM, postsurgical:  Her levothyroxine dose has been increased periodically as needed  She has had symptoms of fatigue reported to also on the last visit However she thinks is more in the last few months; she says she is exhausted when she comes back from work  She think this is related to her thyroid surgery in 2018; however has had a previous COVID infection  She has gained weight again, previously weight was as low as 150 pounds but appears to have the same weight as in 1/23 of 175 pounds this may be partly because of having a lower extremity fracture this summer  She feels that her hand and fingers are cold as well as her face No skin or hair changes   She reports that she is regular with taking her medication in the mornings She thinks she has only missed her medication and rarely and was late for refill only once by 3 days However review of her prescription history available on her medication list indicates that both in May and August her prescription refills were 2 to 3 weeks late at least She takes her vitamins at night Currently she is taking her medication in the car when she is driving to work and she will have  coffee on the way to work and then eat breakfast  Her thyroxine was last increased when her TSH was 5.7 done by PCP on 10/09/2019  She is taking 150 mcg levothyroxine, she still gets her medication from [facility]   TSH was normal as of 12/22  Thyroid labs as follows:  Lab Results  Component Value Date   TSH 2.850 03/17/2021   TSH 2.19 12/11/2019   TSH 1.61 07/24/2019   FREET4 0.96 12/11/2019   FREET4 0.80 07/24/2019   FREET4 1.29 05/01/2019    Lab Results  Component Value Date   TSH 2.850 03/17/2021   TSH 2.19 12/11/2019   TSH 1.61 07/24/2019   TSH 0.50 05/01/2019   TSH 0.32 (L) 11/21/2018    Wt Readings from Last 3 Encounters:  05/08/21 174 lb 12.8 oz (79.3 kg)  03/24/21 162 lb 9.6 oz (73.8 kg)  12/22/19 150 lb 3.2 oz (68.1 kg)    Lab Results  Component Value Date   CALCIUM 9.1 05/01/2019    THYROID cancer:  The patient's thyroid nodule was first discovered by herself in 5/17 when she felt her neck     The thyroid ultrasound showed the following: Dominant solid minimally hypoechoic nodule with some internal cystic degeneration in the right mid gland measures 3.7 x 1.9 x 2.2 cm.  A smaller hypoechoic solid nodule in the upper gland measures 1.0 x 0.6 x 0.8 cm. Needle aspiration of this nodule showed benign follicular adenoma. 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 with another focus of 0.6 cm in the right side No lymph nodes were involved and the tumor was encapsulated  with invasion into but not through the capsule  She was treated with I-131 and 09/2017 with 47 mCi which she tolerated well Follow-up scan showed only activity in the thyroid bed  Thyroglobulin levels have been consistently undetectable as follows  Lab Results  Component Value Date   THGAB <1.0 03/17/2021   THGAB <1.0 07/24/2019   THGAB <1.0 11/21/2018   THGAB <1.0 06/18/2018      Allergies as of 01/19/2022   No Known Allergies      Medication List        Accurate as of January 19, 2022  9:48 AM. If you have any questions, ask your nurse or doctor.          buPROPion 150 MG 24 hr tablet Commonly known as: WELLBUTRIN XL Take by mouth.   buPROPion 300 MG 24 hr tablet Commonly known as: WELLBUTRIN XL Take 300 mg by mouth daily.   fluconazole 150 MG tablet Commonly known as: DIFLUCAN Take 1 tablet (150 mg total) by mouth every 3 (three) days. May take q 72 hrs for resistant yeast infection   fluconazole 150 MG tablet Commonly known as: Diflucan Take 1 tablet (150 mg total) by mouth daily.   HAIR/SKIN/NAILS PO Take 1 capsule by mouth 4 (four) times a week.   levothyroxine 150 MCG tablet Commonly known as: SYNTHROID TAKE ONE TABLET BY MOUTH BEFORE BREAKFAST   LORazepam 1 MG tablet Commonly known as: ATIVAN Take 1 tablet (1 mg total) by mouth 3 (three) times daily.   Vitamin D (Ergocalciferol) 1.25 MG (50000 UNIT) Caps capsule Commonly known as: DRISDOL Take 1 capsule (50,000 Units total) by mouth every 7 (seven) days.        Allergies: No Known Allergies  Past Medical History:  Diagnosis Date   Anxiety    Headache    migraines   HELLP (hemolytic anemia/elev liver enzymes/low platelets in pregnancy)    HELLP (hemolytic anemia/elev liver enzymes/low platelets in pregnancy)    Hypothyroidism    nodule   Preeclampsia      Past Surgical History:  Procedure Laterality Date   BREAST SURGERY     augmentation   CESAREAN SECTION  02/05/14   HELLP  syndrome   THYROID LOBECTOMY Right 03/01/2017   Procedure: RIGHT THYROID LOBECTOMY;  Surgeon: Armandina Gemma, MD;  Location: [facility];  Service: General;  Laterality: Right;   THYROIDECTOMY N/A 03/25/2017   Procedure: COMPLETION THYROIDECTOMY;  Surgeon: Armandina Gemma, MD;  Location: [facility];  Service: General;  Laterality: N/A;   TONSILLECTOMY  2005    Family History  Problem Relation Age of Onset   Heart disease Father    Hypertension Father    Diabetes Father     Social History:  reports that she has quit smoking. She has never used smokeless tobacco. She  reports current alcohol use of about 3.0 standard drinks of alcohol per week. She reports that she does not use drugs.    Review of Systems:   Vitamin D deficiency: Supplemented by her gynecologist with vitamin D, baseline level 27 She was prescribed 50,000 units weekly but not taking this currently and is due for follow-up Last level was 61 as of June 2021  Lab Results  Component Value Date   VD25OH 33.01 07/24/2019   VD25OH 62.74 11/21/2018   VD25OH 26.7 (L) 06/18/2018   Lab Results  Component Value Date   CALCIUM 9.1 05/01/2019    Hair loss: This is not any different recently    Examination:   There were no vitals taken for this visit.          Assessment/Plan:   HYPOTHYROIDISM:  She has postsurgical hypothyroidism  She has increasing fatigue now associated with increased TSH However her fatigue has been persistent for the last few years and etiology is unclear Previously TSH level has been consistently normal on 150 mcg levothyroxine  Although she states that she is taking her levothyroxine fairly regularly her refill history indicates that she has been like with her refills significantly and May and September Also she may not be taking her levothyroxine 30 minutes before eating breakfast or drinking coffee affecting her absorption now  She will now go up to 175 mcg levothyroxine empirically although will  need follow-up in about 8 weeks with repeat thyroid level Consider a combination with liothyronine on the next visit but will also check her T3 levels to establish a baseline  FATIGUE: This may be recently partly due to increased hypothyroidism but she will also continue to work with her PCP including management of menopausal symptoms  Elayne Snare 01/19/2022

## 2022-02-09 ENCOUNTER — Other Ambulatory Visit: Payer: Self-pay

## 2022-02-26 ENCOUNTER — Encounter: Payer: Self-pay | Admitting: Certified Nurse Midwife

## 2022-02-26 DIAGNOSIS — R928 Other abnormal and inconclusive findings on diagnostic imaging of breast: Secondary | ICD-10-CM

## 2022-02-26 NOTE — Telephone Encounter (Signed)
Marie w/[facility] has faxed an order over for signature and review. It has been printed and given to Fairlea.

## 2022-03-02 ENCOUNTER — Other Ambulatory Visit: Payer: 59

## 2022-03-02 ENCOUNTER — Encounter: Payer: Self-pay | Admitting: Endocrinology

## 2022-03-02 NOTE — Addendum Note (Signed)
Addended by: Nils Flack I on: 03/02/2022 02:37 PM   Modules accepted: Orders

## 2022-03-02 NOTE — Addendum Note (Signed)
Addended by: Nils Flack I on: 03/02/2022 02:36 PM   Modules accepted: Orders

## 2022-05-02 ENCOUNTER — Other Ambulatory Visit: Payer: Self-pay | Admitting: Endocrinology

## 2022-08-07 DIAGNOSIS — M25572 Pain in left ankle and joints of left foot: Secondary | ICD-10-CM | POA: Insufficient documentation

## 2022-09-04 ENCOUNTER — Other Ambulatory Visit: Payer: Self-pay | Admitting: Endocrinology

## 2022-10-31 ENCOUNTER — Ambulatory Visit: Payer: 59 | Admitting: Certified Nurse Midwife

## 2022-11-10 ENCOUNTER — Other Ambulatory Visit: Payer: Self-pay | Admitting: Endocrinology

## 2022-11-26 ENCOUNTER — Telehealth: Payer: Self-pay | Admitting: Endocrinology

## 2022-11-26 NOTE — Telephone Encounter (Addendum)
Patient is calling to say that she is headache and light headedness and would like to get thyroid levels checked.  Patient states that this happened last time her  thyroid levels were off.  (Last OV was 01/19/2022.  (Patient states that she can be called at work at [phone] and if no answer cell # [phone])

## 2022-11-27 ENCOUNTER — Other Ambulatory Visit: Payer: Self-pay | Admitting: Endocrinology

## 2022-11-27 DIAGNOSIS — E890 Postprocedural hypothyroidism: Secondary | ICD-10-CM

## 2022-11-27 NOTE — Telephone Encounter (Signed)
Patient is scheduled for labs on 11/28/2022 at 11:45 AM and for follow up with Dr. Erroll Luna on Monday 12/03/2022 at 11:20 AM.

## 2022-11-27 NOTE — Addendum Note (Signed)
Addended by: Reather Littler on: 11/27/2022 08:12 AM   Modules accepted: Orders

## 2022-11-28 ENCOUNTER — Other Ambulatory Visit: Payer: 59

## 2022-11-28 ENCOUNTER — Encounter: Payer: Self-pay | Admitting: Endocrinology

## 2022-11-29 ENCOUNTER — Other Ambulatory Visit (INDEPENDENT_AMBULATORY_CARE_PROVIDER_SITE_OTHER): Payer: 59

## 2022-11-29 DIAGNOSIS — E890 Postprocedural hypothyroidism: Secondary | ICD-10-CM | POA: Diagnosis not present

## 2022-11-29 LAB — TSH: TSH: <0.01 u[IU]/mL — ABNORMAL LOW (ref 0.35–5.50)

## 2022-11-29 LAB — T3, FREE: T3, Free: 3.4 pg/mL (ref 2.3–4.2)

## 2022-11-29 LAB — T4, FREE: Free T4: 1.54 ng/dL (ref 0.60–1.60)

## 2022-12-03 ENCOUNTER — Encounter: Payer: Self-pay | Admitting: Certified Nurse Midwife

## 2022-12-03 ENCOUNTER — Encounter: Payer: Self-pay | Admitting: Endocrinology

## 2022-12-03 ENCOUNTER — Ambulatory Visit (INDEPENDENT_AMBULATORY_CARE_PROVIDER_SITE_OTHER): Payer: 59 | Admitting: Certified Nurse Midwife

## 2022-12-03 ENCOUNTER — Other Ambulatory Visit (HOSPITAL_COMMUNITY)
Admission: RE | Admit: 2022-12-03 | Discharge: 2022-12-03 | Disposition: A | Discharge status: Home / Self Care | Payer: 59 | Source: Ambulatory Visit | Attending: Certified Nurse Midwife | Admitting: Certified Nurse Midwife

## 2022-12-03 ENCOUNTER — Ambulatory Visit: Payer: 59 | Admitting: Endocrinology

## 2022-12-03 VITALS — BP 119/70 | HR 79 | Ht 65.0 in | Wt 157.0 lb

## 2022-12-03 VITALS — BP 124/82 | HR 91 | Ht 65.0 in | Wt 158.0 lb

## 2022-12-03 DIAGNOSIS — Z1151 Encounter for screening for human papillomavirus (HPV): Secondary | ICD-10-CM

## 2022-12-03 DIAGNOSIS — Z124 Encounter for screening for malignant neoplasm of cervix: Secondary | ICD-10-CM | POA: Diagnosis present

## 2022-12-03 DIAGNOSIS — Z13220 Encounter for screening for lipoid disorders: Secondary | ICD-10-CM

## 2022-12-03 DIAGNOSIS — Z0000 Encounter for general adult medical examination without abnormal findings: Secondary | ICD-10-CM

## 2022-12-03 DIAGNOSIS — Z131 Encounter for screening for diabetes mellitus: Secondary | ICD-10-CM

## 2022-12-03 DIAGNOSIS — C73 Malignant neoplasm of thyroid gland: Secondary | ICD-10-CM | POA: Diagnosis not present

## 2022-12-03 DIAGNOSIS — E890 Postprocedural hypothyroidism: Secondary | ICD-10-CM | POA: Diagnosis not present

## 2022-12-03 DIAGNOSIS — Z01419 Encounter for gynecological examination (general) (routine) without abnormal findings: Secondary | ICD-10-CM | POA: Diagnosis not present

## 2022-12-03 DIAGNOSIS — Z1231 Encounter for screening mammogram for malignant neoplasm of breast: Secondary | ICD-10-CM

## 2022-12-03 DIAGNOSIS — Z1211 Encounter for screening for malignant neoplasm of colon: Secondary | ICD-10-CM

## 2022-12-03 DIAGNOSIS — Z3009 Encounter for other general counseling and advice on contraception: Secondary | ICD-10-CM

## 2022-12-03 MED ORDER — LEVOTHYROXINE SODIUM 150 MCG PO TABS: 150.0000 ug | ORAL_TABLET | Freq: Every day | ORAL | 4 refills | Status: DC

## 2022-12-03 NOTE — Patient Instructions (Signed)

## 2022-12-03 NOTE — Progress Notes (Signed)
Outpatient Endocrinology Note [place] Darrin Koman, MD  12/03/22  Patient's Name: Leah Thompson    DOB: December 08, 1975    MRN: [id number]  REASON OF VISIT: Follow-up for papillary thyroid carcinoma/postsurgical hypothyroidism  PCP: Carren Rang, PA-C  HISTORY OF PRESENT ILLNESS:   Leah Thompson is a 47 y.o. old female with past medical history as listed below is presented for a follow up of papillary thyroid carcinoma and postsurgical hypothyroidism.  Patient was last seen by Dr. Lucianne Muss in October 2023.  Pertinent Thyroid History: Patient has follicular variant of papillary thyroid carcinoma status post right thyroid lobectomy in November 2018 followed by completion thyroidectomy in December 2018, status post RAI I-131 therapy in June 2019 with 47 mCi, now on thyroid cancer surveillance and management of postsurgical hypothyroidism.  Background of thyroid cancer : Patient discovered herself in May 2017 felt lump in the neck.  She subsequently had ultrasound which showed dominant solid minimally hypoechoic nodule with some internal cystic degeneration in the right mid gland measured 3.7 x 1.9 x 2.2 cm.  Also a smaller hypoechoic solid nodule in the upper gland measures 1.0 x 0.6 x 0.8 cm.  Fine-needle aspiration of this nodule showed benign follicular adenoma.  She was on levothyroxine suppressive therapy in June 2017.  She had significant increase in size of thyroid nodule on follow-up in September 2018, and subsequent follow-up ultrasound showed 4.8 cm.  She underwent thyroid surgery after that.  Diagnosis: Papillary thyroid carcinoma follicular variant.  Surgical treatment: Right thyroid lobectomy followed by completion thyroidectomy in 2018.  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 Lucianne Muss, no pathology available to review personally).   Radioiodine treatment: I-131 : 47 mCi in June 2019.  Follow-up evaluation / surveillance:  Nuclear medicine body scan: Posttherapy scan only activity in the thyroid bed, no distant metastatic.  - Ultrasound of thyroid bed: none  - Thyroglobulin: Has remained consistently undetectable and low.    Latest Reference Range & Units 07/23/17 09:01 06/18/18 09:44 11/21/18 15:02 07/24/19 13:47 03/17/21 13:45  Thyroglobulin Antibody 0.0 - 0.9 IU/mL <1.0 <1.0 <1.0 <1.0 <1.0  Thyroglobulin by IMA 1.5 - 38.5 ng/mL 0.7 (L)  <0.1 (L) <0.1 (L) 0.1 (L)  (L): Data is abnormally low  Postsurgical hypothyroidism: she was started on levothyroxine 100 mg postoperatively and gradually adjusted / increased over time.    Interval history 12/03/22 Patient has complaints of headache for several weeks.  She is mostly feels warm.  Denies cold intolerance.  No change in bowel habit.  She has been taking phentermine and lost about 10 to 15 pound of body weight in last few months.  No palpitation.  No increased sweating.  She complaints of fatigue. She has felt hair is getting better. She has been taking levothyroxine in the morning usually prior to breakfast, sometimes close to the breakfast/coffee.  She has not been taking vitamin or biotin in the morning.  She does not take biotin anymore.  No lump or any nodule in the neck.  Labs reviewed as follows.  In last year September 2023 she had TSH of 10.393 and levothyroxine was increased from 150 to 175 mcg daily.  There is no repeat lab after that.  Lab few days ago was suppressed TSH with normal free T4 and free T3  as follows.   Latest Reference Range & Units 11/29/22 11:26  TSH 0.35 - 5.50 uIU/mL <0.01 (L)  Triiodothyronine,Free,Serum 2.3 - 4.2 pg/mL 3.4  T4,Free(Direct) 0.60 - 1.60 ng/dL 7.06  (L): Data is abnormally low  THYROID  [facility]09[facility]29[facility] Component 01/12/2022  01/12/2022 12/08/2021         Thyroid Stimulating Hormone (TSH) -- 10.393 High     21.596 High     Load older lab results  TSH -- -- -- Load older lab results  Thyroxine, Free (FT4) 0.82 -- --    She has been taking levothyroxine 175 mcg daily.   REVIEW OF SYSTEMS:  As per history of present illness.   PAST MEDICAL HISTORY: Past Medical History:  Diagnosis Date   Anxiety    Headache    migraines   HELLP (hemolytic anemia/elev liver enzymes/low platelets in pregnancy)    HELLP (hemolytic anemia/elev liver enzymes/low platelets in pregnancy)    Hypothyroidism    nodule   Preeclampsia     PAST SURGICAL HISTORY: Past Surgical History:  Procedure Laterality Date   BREAST SURGERY     augmentation   CESAREAN SECTION  02/05/14   HELLP syndrome   THYROID LOBECTOMY Right 03/01/2017   Procedure: RIGHT THYROID LOBECTOMY;  Surgeon: Darnell Level, MD;  Location: [facility];  Service: General;  Laterality: Right;   THYROIDECTOMY N/A 03/25/2017   Procedure: COMPLETION THYROIDECTOMY;  Surgeon: Darnell Level, MD;  Location: [facility];  Service: General;  Laterality: N/A;   TONSILLECTOMY  2005    ALLERGIES: No Known Allergies  FAMILY HISTORY:  Family History  Problem Relation Age of Onset   Heart disease Father    Hypertension Father    Diabetes Father     SOCIAL HISTORY: Social History   Socioeconomic History   Marital status: Married    Spouse name: Not on file   Number of children: Not on file   Years of education: Not on file   Highest education level: Not on file  Occupational History   Not on file  Tobacco Use   Smoking status: Former   Smokeless tobacco: Never   Tobacco comments:    hx of socially  Vaping Use   Vaping status: Never Used  Substance and Sexual Activity   Alcohol use: Yes    Alcohol/week: 3.0 standard drinks of alcohol    Types: 3 Glasses of wine per week    Comment: social   Drug use: No   Sexual activity: Yes    Birth control/protection: None   Other Topics Concern   Not on file  Social History Narrative   Not on file   Social Determinants of Health   Financial Resource Strain: Not on file  Food Insecurity: Not on file  Transportation Needs: Not on file  Physical Activity: Not on file  Stress: Not on file  Social Connections: Not on file    MEDICATIONS:  Current Outpatient Medications  Medication Sig Dispense Refill   levothyroxine (SYNTHROID) 150 MCG tablet Take 1 tablet (150 mcg total) by mouth daily. TAKE ONE TABLET BY MOUTH EVERY DAY 90 tablet 4   LORazepam (ATIVAN) 1 MG tablet Take 1 tablet (1 mg total) by mouth 3 (three) times daily. (Patient not taking: Reported on 12/03/2022) 90 tablet 0   No current facility-administered medications for this visit.    PHYSICAL EXAM: Vitals:   12/03/22 1101  BP: 124/82  Pulse: 91  SpO2: 97%  Weight: 158 lb (71.7 kg)  Height: 5\' 5"  (1.651 m)   Body mass index is 26.29 kg/m.  @WEIGHT (3)@     General: Well developed, well nourished female in no apparent distress.  HEENT: AT/[place], no external lesions. Hearing intact to the spoken word Eyes: EOMI. No stare, proptosis or lid lag. Conjunctiva clear and no icterus. Neck: Trachea midline, neck supple without appreciable thyromegaly or lymphadenopathy and no palpable thyroid nodules Lungs: Clear to auscultation, no wheeze. Respirations not labored Heart: S1S2, Regular in rate and rhythm. No loud murmurs Abdomen: Soft, non tender, non distended, no masses, no striae Neurologic: Alert, oriented, normal speech, deep tendon biceps reflexes normal,  no gross focal neurological deficit Extremities: No pedal pitting edema, no tremors of outstretched hands Skin: Warm, color good. No sores or rashes noted Psychiatric: Does not appear depressed or anxious  PERTINENT HISTORIC LABORATORY AND IMAGING STUDIES:  All pertinent laboratory results were reviewed. Please see HPI also for further details.   TSH  Date Value Ref Range Status   11/29/2022 <0.01 (L) 0.35 - 5.50 uIU/mL Final  03/17/2021 2.850 0.450 - 4.500 uIU/mL Final  12/11/2019 2.19 0.35 - 4.50 uIU/mL Final     ASSESSMENT / PLAN  1. Papillary thyroid carcinoma (HCC)   2. Postsurgical hypothyroidism     - Patient has h/o papillary thyroid carcinoma multifocal 4 cm, no LN +, capsule invasion, follicular variant, s/p right lobectomy in 02/2027 and completion thyroidectomy in 03/2027, s/p RAI ablation I-131 with .  - Thyroid cancer surveillance with negative and low 0.1 thyroglobulin and thyroglobulin antibody in the past, over all reassuring.  - Discussed that headache may or may not be related thyroid disorder and fatigue can be multifactorial.  Plan: - Decrease levothyroxine from 175 to 150 mcg daily.  - Check TSH, FT4, thyroglobulin, thyroglobulin antibody in 8 weeks. - US thyroid for thyroid cancer surveillance.  - Discussed about proper intake of levothyroxine.   Leah Thompson "Jeanice Lim" was seen today for follow-up.  Diagnoses and all orders for this visit:  Papillary thyroid carcinoma (HCC) -     US THYROID; Future -     T4, free; Future -     TSH; Future -     Thyroglobulin antibody; Future -     Thyroglobulin Level; Future  Postsurgical hypothyroidism -     T4, free; Future -     TSH; Future  Other orders -     levothyroxine (SYNTHROID) 150 MCG tablet; Take 1 tablet (150 mcg total) by mouth daily. TAKE ONE TABLET BY MOUTH EVERY DAY    DISPOSITION Follow up in clinic in 2 months suggested.  All questions answered and patient verbalized understanding of the plan.  Iraq Mahlani Berninger, MD [facility] Endocrinology [facility] [address] [place], [address] [place], [place] [address] Phone # [phone]  At least part of this note was generated using voice recognition software. Inadvertent word errors may have occurred, which were not recognized during the proofreading process.

## 2022-12-03 NOTE — Patient Instructions (Addendum)
Decrease levothyroxine to 150 mcg daily.  US neck plan for cancer monitoring.   Lab in 8 weeks.  Follow up with me in 2 months.    Things to know about Levothyroxine:   1.  The ideal time to take levothyroxine is on an empty stomach in the morning approximately thirty to sixty minutes before any food or milk products.  However, if it is easier to remember at bedtime, etc. that's okay as long as it's on an empty stomach ie after about four hours after big meal.    2.  Ideally, also, you shouldn't take other medications at the same time.  We do know that certain medications will interfere with the absorption of your Levothyroxine: Iron pills, calcium or milk products, multi vitamins, and stomach or acid medications.  Just take them a few hours 3 to 4 hours apart.  3.  Each brand seems to have variations compared to other brands, so a problem can be to switch brands (sometimes happens with getting a generic), because we base your dose on your blood tests.  No other problems with that generics and Brand name medication costs more.   4.  It is best to get in the habit of taking the levothyroxine daily.  However, this is a medication that you make up missed doses.  That is, if you forget to take it for a day or two, you take all of the missed doses as soon as you remember.   5.  There are no "side effects" to thyroid hormone - is identical to your body's hormone, only symptoms of over and under treatment.  Iraq Lewi Drost, MD [facility] Endocrinology [facility] [address] [place], [address] [place], [place] [address] Phone # [phone]

## 2022-12-03 NOTE — Progress Notes (Signed)
ANNUAL EXAM Patient name: Leah Thompson MRN [id number]  Date of birth: 1976/01/16 Chief Complaint:   Annual Exam  History of Present Illness:   Leah Thompson is a 47 y.o. G30P0011 Caucasian female being seen today for a routine annual exam.  Current complaints: headaches-these happened last year as well, associated with sinus pressure. Would like screening labs completed.   Patient's last menstrual period was 11/19/2022 (approximate).   Upstream - 12/03/22 0836       Pregnancy Intention Screening   Does the patient want to become pregnant in the next year? No    Does the patient's partner want to become pregnant in the next year? No    Would the patient like to discuss contraceptive options today? No      Contraception Wrap Up   Current Method No Contraceptive Precautions    Contraception Counseling Provided Yes    How was the end contraceptive method provided? N/A            The pregnancy intention screening data noted above was reviewed. Potential methods of contraception were discussed. The patient elected to proceed with No data recorded.      Component Value Date/Time   DIAGPAP  05/08/2021 0926    - Negative for intraepithelial lesion or malignancy (NILM)   DIAGPAP  04/14/2019 1534    - Negative for intraepithelial lesion or malignancy (NILM)   HPVHIGH Negative 04/14/2019 1534   ADEQPAP  05/08/2021 0926    Satisfactory for evaluation; transformation zone component PRESENT.   ADEQPAP  04/14/2019 1534    Satisfactory for evaluation; transformation zone component PRESENT.       Last pap 1/23. Results were: NILM w/ HRHPV negative. H/O abnormal pap: no Last mammogram: 11/23. Results were: BIRADS 3, repeat in one year recommended. Family h/o breast cancer: no Last colonoscopy: n/a. Results were: N/A. Family h/o colorectal cancer: no     12/03/2022   11:46 AM 05/08/2021    9:34 AM  Depression screen PHQ 2/9  Decreased Interest 0 1  Down,  Depressed, Hopeless 1 1  PHQ - 2 Score 1 2  Tired, decreased energy  1  Change in appetite  2  Feeling bad or failure about yourself   0  Trouble concentrating  0  Moving slowly or fidgety/restless  0  Suicidal thoughts  0  Difficult doing work/chores  Not difficult at all        05/08/2021    9:35 AM  GAD 7 : Generalized Anxiety Score  Nervous, Anxious, on Edge 0  Control/stop worrying 0  Worry too much - different things 1  Trouble relaxing 0  Restless 0  Easily annoyed or irritable 1  Afraid - awful might happen 0  Total GAD 7 Score 2  Anxiety Difficulty Not difficult at all     Review of Systems:   Pertinent items are noted in HPI Denies shortness of breath, chest pain, abdominal pain, abnormal vaginal discharge/itching/odor/irritation, problems with periods, bowel movements, urination, or intercourse unless otherwise stated above. Pertinent History Reviewed:  Reviewed past medical,surgical, social and family history.  Reviewed problem list, medications and allergies. Physical Assessment:   Vitals:   12/03/22 0833  BP: 119/70  Pulse: 79  Weight: 157 lb (71.2 kg)  Height: 5\' 5"  (1.651 m)  Body mass index is 26.13 kg/m.       Physical Exam Vitals reviewed.  Constitutional:      Appearance: Normal appearance.  HENT:  Head: Normocephalic.  Neck:     Thyroid: No thyroid mass or thyromegaly.     Comments: Thyroid surgically absent Cardiovascular:     Rate and Rhythm: Normal rate and regular rhythm.     Heart sounds: Normal heart sounds.  Pulmonary:     Effort: Pulmonary effort is normal.     Breath sounds: Normal breath sounds.  Chest:  Breasts:    Tanner Score is 5.     Right: Normal.     Left: Normal.  Abdominal:     General: Abdomen is flat.     Palpations: Abdomen is soft.     Tenderness: There is no abdominal tenderness.  Genitourinary:    General: Normal vulva.     Vagina: Normal.     Cervix: Normal.  Musculoskeletal:     Cervical back:  Neck supple. No tenderness.  Skin:    General: Skin is warm and dry.  Neurological:     General: No focal deficit present.     Mental Status: She is alert and oriented to person, place, and time.  Psychiatric:        Mood and Affect: Mood normal.        Behavior: Behavior normal.      No results found for this or any previous visit (from the past 24 hour(s)).  Assessment & Plan:  1. Encounter for well woman exam - Lipid panel - Hemoglobin A1c - Comprehensive metabolic panel - CBC - VITAMIN D 25 Hydroxy (Vit-D Deficiency, Fractures) - B12 and Folate Panel  2. Screening for diabetes mellitus - Hemoglobin A1c  3. Lipid screening - Lipid panel  4. Encounter for annual physical exam - Lipid panel - Hemoglobin A1c - Comprehensive metabolic panel - CBC - VITAMIN D 25 Hydroxy (Vit-D Deficiency, Fractures) - B12 and Folate Panel  5. Cervical cancer screening - Cytology - PAP  6. Encounter for screening for human papillomavirus (HPV) - Cytology - PAP  7. Breast cancer screening by mammogram - MM 3D SCREENING MAMMOGRAM BILATERAL BREAST; Future  8. Visit for screening mammogram  9. Colon cancer screening - Cologuard  10. Encounter for counseling regarding contraception   Options for contraception & management of heavy cycles reviewed, would avoid estrogen containing contraceptive due to history of migraine with aura. Consider magnesium supplement for headaches, trial Claritin or nasal allergy spray (Flonase or Nasonex) given headaches occurred at this time of year last year as well.  Mammogram:  ordered today-desires at [facility], due in November , or sooner if problems Colon cancer screening: Cologuard ordered  Orders Placed This Encounter  Procedures   MM 3D SCREENING MAMMOGRAM BILATERAL BREAST   Lipid panel   Hemoglobin A1c   Comprehensive metabolic panel   CBC   VITAMIN D 25 Hydroxy (Vit-D Deficiency, Fractures)   B12 and Folate Panel   Cologuard    Meds: No  orders of the defined types were placed in this encounter.   Follow-up: Return in 1 year (on 12/03/2023) for Annual exam.  Dominica Severin, CNM 12/03/2022 11:46 AM

## 2022-12-04 LAB — VITAMIN D 25 HYDROXY (VIT D DEFICIENCY, FRACTURES): Vit D, 25-Hydroxy: 34.2 ng/mL (ref 30.0–100.0)

## 2022-12-04 LAB — LIPID PANEL
Chol/HDL Ratio: 2.5 ratio (ref 0.0–4.4)
Cholesterol, Total: 186 mg/dL (ref 100–199)
HDL: 74 mg/dL (ref >39)
LDL Chol Calc (NIH): 94 mg/dL (ref 0–99)
Triglycerides: 101 mg/dL (ref 0–149)
VLDL Cholesterol Cal: 18 mg/dL (ref 5–40)

## 2022-12-04 LAB — COMPREHENSIVE METABOLIC PANEL
ALT: 14 IU/L (ref 0–32)
AST: 20 IU/L (ref 0–40)
Albumin: 4.4 g/dL (ref 3.9–4.9)
Alkaline Phosphatase: 74 IU/L (ref 44–121)
BUN/Creatinine Ratio: 21 (ref 9–23)
BUN: 14 mg/dL (ref 6–24)
Bilirubin Total: 0.5 mg/dL (ref 0.0–1.2)
CO2: 23 mmol/L (ref 20–29)
Calcium: 9 mg/dL (ref 8.7–10.2)
Chloride: 105 mmol/L (ref 96–106)
Creatinine, Ser: 0.66 mg/dL (ref 0.57–1.00)
Globulin, Total: 2 g/dL (ref 1.5–4.5)
Glucose: 80 mg/dL (ref 70–99)
Potassium: 4.4 mmol/L (ref 3.5–5.2)
Sodium: 143 mmol/L (ref 134–144)
Total Protein: 6.4 g/dL (ref 6.0–8.5)
eGFR: 110 mL/min/{1.73_m2} (ref >59)

## 2022-12-04 LAB — CBC
Hematocrit: 39.9 % (ref 34.0–46.6)
Hemoglobin: 12.7 g/dL (ref 11.1–15.9)
MCH: 29.1 pg (ref 26.6–33.0)
MCHC: 31.8 g/dL (ref 31.5–35.7)
MCV: 92 fL (ref 79–97)
Platelets: 259 10*3/uL (ref 150–450)
RBC: 4.36 x10E6/uL (ref 3.77–5.28)
RDW: 11.5 % — ABNORMAL LOW (ref 11.7–15.4)
WBC: 4.9 10*3/uL (ref 3.4–10.8)

## 2022-12-04 LAB — B12 AND FOLATE PANEL
Folate: 10.1 ng/mL (ref >3.0)
Vitamin B-12: 512 pg/mL (ref 232–1245)

## 2022-12-04 LAB — CYTOLOGY - PAP
Comment: NEGATIVE
Diagnosis: NEGATIVE
High risk HPV: NEGATIVE

## 2022-12-04 LAB — HEMOGLOBIN A1C
Est. average glucose Bld gHb Est-mCnc: 94 mg/dL
Hgb A1c MFr Bld: 4.9 % (ref 4.8–5.6)

## 2022-12-05 NOTE — Telephone Encounter (Signed)
Please advise 

## 2023-01-09 ENCOUNTER — Encounter: Payer: Self-pay | Admitting: Endocrinology

## 2023-01-23 ENCOUNTER — Other Ambulatory Visit (INDEPENDENT_AMBULATORY_CARE_PROVIDER_SITE_OTHER): Payer: 59

## 2023-01-23 DIAGNOSIS — E890 Postprocedural hypothyroidism: Secondary | ICD-10-CM

## 2023-01-23 DIAGNOSIS — C73 Malignant neoplasm of thyroid gland: Secondary | ICD-10-CM

## 2023-01-23 LAB — TSH: TSH: 0.01 u[IU]/mL — ABNORMAL LOW (ref 0.35–5.50)

## 2023-01-23 LAB — T4, FREE: Free T4: 1.03 ng/dL (ref 0.60–1.60)

## 2023-01-24 LAB — THYROGLOBULIN LEVEL: Thyroglobulin: 0.1 ng/mL — ABNORMAL LOW

## 2023-01-24 LAB — THYROGLOBULIN ANTIBODY: Thyroglobulin Ab: <1 [IU]/mL (ref <=1)

## 2023-01-31 ENCOUNTER — Encounter: Payer: Self-pay | Admitting: Endocrinology

## 2023-01-31 ENCOUNTER — Telehealth: Payer: 59 | Admitting: Endocrinology

## 2023-01-31 DIAGNOSIS — C73 Malignant neoplasm of thyroid gland: Secondary | ICD-10-CM | POA: Diagnosis not present

## 2023-01-31 DIAGNOSIS — E890 Postprocedural hypothyroidism: Secondary | ICD-10-CM | POA: Diagnosis not present

## 2023-01-31 NOTE — Progress Notes (Signed)
I connected with  Shearon Stalls on 01/31/23 at 9:05 AM by a video enabled telemedicine application and verified that I am speaking with the correct person using two identifiers.   I discussed the limitations of evaluation and management by telemedicine. The patient expressed understanding and agreed to proceed.   Patient location : [place] at work.   Provider location : at the office.  Persons participating in video visit: patient and myself.  Duration of video interaction : 8 minutes.   Outpatient Endocrinology Note  / Virtual Video Visit [place] Ajamu Maxon, MD  01/31/23  Patient's Name: Leah Thompson    DOB: 01/27/1976    MRN: [id number]  REASON OF VISIT: Follow-up for papillary thyroid carcinoma/postsurgical hypothyroidism  PCP: Carren Rang, PA-C  HISTORY OF PRESENT ILLNESS:   Leah Thompson is a 47 y.o. old Thompson with past medical history as listed below is presented for a follow up of papillary thyroid carcinoma and postsurgical hypothyroidism.    Pertinent Thyroid History: Patient has follicular variant of papillary thyroid carcinoma status post right thyroid lobectomy in November 2018 followed by completion thyroidectomy in December 2018, status post RAI I-131 therapy in June 2019 with 47 mCi, now on thyroid cancer surveillance and management of postsurgical hypothyroidism.  Background of thyroid cancer : Patient discovered herself in May 2017 felt lump in the neck.  She subsequently had ultrasound which showed dominant solid minimally hypoechoic nodule with some internal cystic degeneration in the right mid gland measured 3.7 x 1.9 x 2.2 cm.  Also a smaller hypoechoic solid nodule in the upper gland measures 1.0 x 0.6 x 0.8 cm.  Fine-needle aspiration of this nodule showed benign follicular adenoma.  She was on levothyroxine suppressive therapy in June 2017.  She had significant increase in size of thyroid nodule on follow-up in  September 2018, and subsequent follow-up ultrasound showed 4.8 cm.  She underwent thyroid surgery after that.  Diagnosis: Papillary thyroid carcinoma follicular variant.  Surgical treatment: Right thyroid lobectomy followed by completion thyroidectomy in 2018.  Pathology: 4 cm follicular variant of papillary thyroid carcinoma with another focus of 0.6 cm in the right side, no lymph nodes were involved and tumor was encapsulated with invasion into but not through the capsule.( Pathology per office note by Dr. Lucianne Muss, no pathology available to review personally).   Radioiodine treatment: I-131 : 47 mCi in June 2019.  Follow-up evaluation / surveillance:  Nuclear medicine body scan: Posttherapy scan only activity in the thyroid bed, no distant metastatic.  - Ultrasound of thyroid bed: none  - Thyroglobulin: Has remained consistently undetectable and low.    Latest Reference Range & Units 07/23/17 09:01 06/18/18 09:44 11/21/18 15:02 07/24/19 13:47 03/17/21 13:45  Thyroglobulin Antibody 0.0 - 0.9 IU/mL <1.0 <1.0 <1.0 <1.0 <1.0  Thyroglobulin by IMA 1.5 - 38.5 ng/mL 0.7 (L)  <0.1 (L) <0.1 (L) 0.1 (L)  (L): Data is abnormally low  Postsurgical hypothyroidism: she was started on levothyroxine 100 mg postoperatively and gradually adjusted / increased over time.    Interval history 01/31/23 Patient had recent lab with improving TSH and thyroglobulin is a stable 0.1.  Levothyroxine dose was decreased in last visit and she has been feeling better.  No more heat intolerance.  No palpitation.  Overall normal energy.  She is currently taking levothyroxine 150 mcg daily.  No other complaints today.    Latest Reference Range & Units 01/23/23 12:07  TSH 0.35 - 5.50 uIU/mL 0.01 (L)  T4,Free(Direct) 0.60 - 1.60 ng/dL 8.41  Thyroglobulin ng/mL 0.1 (L)  Thyroglobulin Ab < or = 1 IU/mL <1  (L): Data is abnormally low   REVIEW OF SYSTEMS:  As per history of present illness.   PAST MEDICAL  HISTORY: Past Medical History:  Diagnosis Date   Anxiety    Headache    migraines   HELLP (hemolytic anemia/elev liver enzymes/low platelets in pregnancy)    HELLP (hemolytic anemia/elev liver enzymes/low platelets in pregnancy)    Hypothyroidism    nodule   Preeclampsia     PAST SURGICAL HISTORY: Past Surgical History:  Procedure Laterality Date   BREAST SURGERY     augmentation   CESAREAN SECTION  02/05/14   HELLP syndrome   THYROID LOBECTOMY Right 03/01/2017   Procedure: RIGHT THYROID LOBECTOMY;  Surgeon: Darnell Level, MD;  Location: [facility];  Service: General;  Laterality: Right;   THYROIDECTOMY N/A 03/25/2017   Procedure: COMPLETION THYROIDECTOMY;  Surgeon: Darnell Level, MD;  Location: [facility];  Service: General;  Laterality: N/A;   TONSILLECTOMY  2005    ALLERGIES: No Known Allergies  FAMILY HISTORY:  Family History  Problem Relation Age of Onset   Heart disease Father    Hypertension Father    Diabetes Father     SOCIAL HISTORY: Social History   Socioeconomic History   Marital status: Married    Spouse name: Not on file   Number of children: Not on file   Years of education: Not on file   Highest education level: Not on file  Occupational History   Not on file  Tobacco Use   Smoking status: Former   Smokeless tobacco: Never   Tobacco comments:    hx of socially  Vaping Use   Vaping status: Never Used  Substance and Sexual Activity   Alcohol use: Yes    Alcohol/week: 3.0 standard drinks of alcohol    Types: 3 Glasses of wine per week    Comment: social   Drug use: No   Sexual activity: Yes    Birth control/protection: None  Other Topics Concern   Not on file  Social History Narrative   Not on file   Social Determinants of Health   Financial Resource Strain: Not on file  Food Insecurity: Not on file  Transportation Needs: Not on file  Physical Activity: Not on file  Stress: Not on file  Social Connections: Not on file    MEDICATIONS:   Current Outpatient Medications  Medication Sig Dispense Refill   levothyroxine (SYNTHROID) 150 MCG tablet Take 1 tablet (150 mcg total) by mouth daily. TAKE ONE TABLET BY MOUTH EVERY DAY 90 tablet 4   LORazepam (ATIVAN) 1 MG tablet Take 1 tablet (1 mg total) by mouth 3 (three) times daily. (Patient not taking: Reported on 12/03/2022) 90 tablet 0   No current facility-administered medications for this visit.    PHYSICAL EXAM: There were no vitals filed for this visit.  There is no height or weight on file to calculate BMI.   General: Well developed, well nourished Thompson in no apparent distress.  HEENT: Hearing intact to the spoken word Neurologic: Alert, oriented, normal speech Psychiatric: Does not appear depressed or anxious  PERTINENT HISTORIC LABORATORY AND IMAGING STUDIES:  All pertinent laboratory results were reviewed. Please see HPI also for further details.   TSH  Date Value Ref Range Status  01/23/2023 0.01 (L) 0.35 - 5.50 uIU/mL Final  11/29/2022 <0.01 (L) 0.35 - 5.50 uIU/mL Final  03/17/2021 2.850 0.450 - 4.500 uIU/mL Final     ASSESSMENT / PLAN  1. Postsurgical hypothyroidism   2. Papillary thyroid carcinoma (HCC)    - Patient has h/o papillary thyroid carcinoma multifocal 4 cm, no LN +, capsule invasion, follicular variant, s/p right lobectomy in 02/2017 and completion thyroidectomy in 03/2017, s/p RAI ablation I-131 with .  - Thyroid cancer surveillance with negative and low 0.1 thyroglobulin and thyroglobulin antibody in the past, over all reassuring.  Recent thyroglobulin is stable 0.1.  TSH is improving and at the goal for having history of thyroid cancer. -She has not completed ultrasound neck for thyroid cancer surveillance as planned in last visit.  Plan: -Continue levothyroxine 150 mcg daily.  - Check TSH, FT4 in 3 months prior to follow-up visit. -Complete US thyroid for thyroid cancer surveillance.   Diagnoses and all orders for this  visit:  Postsurgical hypothyroidism -     T4, free; Future -     TSH; Future  Papillary thyroid carcinoma (HCC)     DISPOSITION Follow up in clinic in 3 months suggested.  All questions answered and patient verbalized understanding of the plan.  [place] Annalycia Done, MD [facility] Endocrinology [facility] [address] [place], [address] [place], [place] [address] Phone # [phone]  At least part of this note was generated using voice recognition software. Inadvertent word errors may have occurred, which were not recognized during the proofreading process.

## 2023-02-04 ENCOUNTER — Inpatient Hospital Stay
Admission: RE | Admit: 2023-02-04 | Discharge: 2023-02-04 | Discharge status: Home / Self Care | Payer: 59 | Source: Ambulatory Visit | Attending: Endocrinology

## 2023-02-04 DIAGNOSIS — C73 Malignant neoplasm of thyroid gland: Secondary | ICD-10-CM

## 2023-02-05 ENCOUNTER — Encounter: Payer: Self-pay | Admitting: Endocrinology

## 2023-03-12 ENCOUNTER — Encounter: Payer: Self-pay | Admitting: Endocrinology

## 2023-03-18 NOTE — Telephone Encounter (Signed)
I think We received that letter, signed and return few days ago.

## 2023-03-21 ENCOUNTER — Encounter: Payer: Self-pay | Admitting: Endocrinology

## 2023-07-10 ENCOUNTER — Encounter: Payer: Self-pay | Admitting: Certified Nurse Midwife

## 2023-07-10 DIAGNOSIS — B379 Candidiasis, unspecified: Secondary | ICD-10-CM

## 2023-07-11 MED ORDER — FLUCONAZOLE 150 MG PO TABS: 150.0000 mg | ORAL_TABLET | Freq: Every day | ORAL | 0 refills | Status: DC

## 2023-07-31 ENCOUNTER — Other Ambulatory Visit: Payer: Self-pay

## 2023-07-31 DIAGNOSIS — B379 Candidiasis, unspecified: Secondary | ICD-10-CM

## 2023-07-31 MED ORDER — FLUCONAZOLE 150 MG PO TABS: 150.0000 mg | ORAL_TABLET | Freq: Every day | ORAL | 0 refills | Status: AC

## 2023-07-31 NOTE — Telephone Encounter (Signed)
 Confirmed with [facility] Rx was not picked up. Diflucan sent to [facility] per pt's request.

## 2023-08-30 ENCOUNTER — Encounter: Payer: Self-pay | Admitting: Endocrinology

## 2023-08-30 DIAGNOSIS — E890 Postprocedural hypothyroidism: Secondary | ICD-10-CM

## 2023-09-02 ENCOUNTER — Telehealth: Payer: Self-pay

## 2023-09-02 MED ORDER — LEVOTHYROXINE SODIUM 137 MCG PO TABS: 137.0000 ug | ORAL_TABLET | Freq: Every day | ORAL | 3 refills | Status: DC

## 2023-09-02 NOTE — Telephone Encounter (Signed)
 Patient given medication changes as directed by MD. No further questions at this time. Patient transferred to front desk to place lab appointment

## 2023-09-02 NOTE — Telephone Encounter (Signed)
 Labs reviewed TSH 0.007 and free T41.65.  She is currently taking levothyroxine  150 mcg daily.  I would like to decrease the dose of levothyroxine  to 137 mcg daily.  Check TSH, free T4 in 2 months.  Make follow-up appointment with me in 2 months.  Plan for lab visit about 1 week prior to follow-up visit with me.  Nursing : Order placed.  Please arrange for visits as above.  [place]raq Shankar Silber, MD [facility] Endocrinology [facility] [address] [place], [address] [place], [place] [address] Phone # [phone]

## 2023-10-01 ENCOUNTER — Other Ambulatory Visit (HOSPITAL_COMMUNITY): Payer: Self-pay

## 2023-10-01 ENCOUNTER — Other Ambulatory Visit: Payer: Self-pay | Admitting: Physician Assistant

## 2023-10-01 ENCOUNTER — Telehealth: Payer: Self-pay

## 2023-10-01 DIAGNOSIS — R519 Headache, unspecified: Secondary | ICD-10-CM

## 2023-10-01 NOTE — Telephone Encounter (Signed)
 Pharmacy Patient Advocate Encounter   Received notification from CoverMyMeds that prior authorization for Levothyroxine  Sodium 137MCG tablets is required/requested.   Insurance verification completed.   The patient is insured through [facility] .   Per test claim: Refill too soon. PA is not needed at this time. Medication was filled 09/25/23. Next eligible fill date is 10/18/23.

## 2023-10-02 ENCOUNTER — Encounter: Payer: Self-pay | Admitting: Physician Assistant

## 2023-10-28 ENCOUNTER — Ambulatory Visit
Admission: RE | Admit: 2023-10-28 | Discharge: 2023-10-28 | Disposition: A | Discharge status: Home / Self Care | Source: Ambulatory Visit | Attending: Physician Assistant | Admitting: Physician Assistant

## 2023-10-28 DIAGNOSIS — R519 Headache, unspecified: Secondary | ICD-10-CM

## 2023-11-07 ENCOUNTER — Other Ambulatory Visit

## 2023-11-07 ENCOUNTER — Encounter: Payer: Self-pay | Admitting: Certified Nurse Midwife

## 2023-11-07 DIAGNOSIS — B379 Candidiasis, unspecified: Secondary | ICD-10-CM

## 2023-11-07 MED ORDER — FLUCONAZOLE 150 MG PO TABS: 150.0000 mg | ORAL_TABLET | Freq: Once | ORAL | 0 refills | Status: AC

## 2023-11-08 ENCOUNTER — Encounter: Payer: Self-pay | Admitting: Endocrinology

## 2023-11-08 ENCOUNTER — Other Ambulatory Visit

## 2023-11-08 LAB — TSH: TSH: 0.02 m[IU]/L — ABNORMAL LOW

## 2023-11-08 LAB — T4, FREE: Free T4: 1.6 ng/dL (ref 0.8–1.8)

## 2023-11-12 ENCOUNTER — Ambulatory Visit: Payer: Self-pay | Admitting: Endocrinology

## 2023-11-12 ENCOUNTER — Ambulatory Visit (INDEPENDENT_AMBULATORY_CARE_PROVIDER_SITE_OTHER): Admitting: Endocrinology

## 2023-11-12 ENCOUNTER — Encounter: Payer: Self-pay | Admitting: Endocrinology

## 2023-11-12 VITALS — BP 110/80 | HR 77 | Resp 16 | Ht 65.0 in | Wt 146.2 lb

## 2023-11-12 DIAGNOSIS — C73 Malignant neoplasm of thyroid gland: Secondary | ICD-10-CM

## 2023-11-12 DIAGNOSIS — E890 Postprocedural hypothyroidism: Secondary | ICD-10-CM

## 2023-11-12 MED ORDER — LEVOTHYROXINE SODIUM 125 MCG PO TABS: 125.0000 ug | ORAL_TABLET | Freq: Every day | ORAL | 3 refills | Status: AC

## 2023-11-12 NOTE — Progress Notes (Signed)
 Outpatient Endocrinology Note  / Virtual Video Visit Leah Jaquari Reckner, MD  11/12/23  Patient's Name: Leah Thompson    DOB: 24-Feb-1976    MRN: [id number]  REASON OF VISIT: Follow-up for papillary thyroid  carcinoma/postsurgical hypothyroidism  PCP: Franchot Houston, PA-C  HISTORY OF PRESENT ILLNESS:   Leah Thompson is a 48 y.o. old female with past medical history as listed below is presented for a follow up of papillary thyroid  carcinoma and postsurgical hypothyroidism.    Pertinent Thyroid  History: Patient has follicular variant of papillary thyroid  carcinoma status post right thyroid  lobectomy in November 2018 followed by completion thyroidectomy in December 2018, status post RAI I-131 therapy in June 2019 with 47 mCi, now on thyroid  cancer surveillance and management of postsurgical hypothyroidism.  Background of thyroid  cancer : Patient discovered herself in May 2017 felt lump in the neck.  She subsequently had ultrasound which showed dominant solid minimally hypoechoic nodule with some internal cystic degeneration in the right mid gland measured 3.7 x 1.9 x 2.2 cm.  Also a smaller hypoechoic solid nodule in the upper gland measures 1.0 x 0.6 x 0.8 cm.  Fine-needle aspiration of this nodule showed benign follicular adenoma.  She was on levothyroxine  suppressive therapy in June 2017.  She had significant increase in size of thyroid  nodule on follow-up in September 2018, and subsequent follow-up ultrasound showed 4.8 cm.  She underwent thyroid  surgery after that.  Diagnosis: Papillary thyroid  carcinoma follicular variant.  Surgical treatment: Right thyroid  lobectomy followed by completion thyroidectomy in 2018.  Pathology: 4 cm follicular variant of papillary thyroid  carcinoma with another focus of 0.6 cm in the right side, no lymph nodes were involved and tumor was encapsulated with invasion into but not through the capsule.( Pathology per office note by Dr. Von, no  pathology available to review personally).   Radioiodine treatment: I-131 : 47 mCi in June 2019.  Follow-up evaluation / surveillance:  Nuclear medicine body scan: Posttherapy scan only activity in the thyroid  bed, no distant metastatic.  - Ultrasound of thyroid  bed: Negative in October 2024. - Thyroglobulin: Has remained consistently undetectable and low.   Postsurgical hypothyroidism: she was started on levothyroxine  100 mg postoperatively and gradually adjusted / increased over time.    Interval history  Patient has been taking levothyroxine  137 mcg daily.  Dose was decreased in October 2024.  Patient complains of intermittent fatigue at different times of the day.  Denies palpitation and heat intolerance.  She has been feeling having less tolerance to the heat lately.  She lost weight currently taking Zepbound, had taken phentermine  in the past.  Lately body weight has been relatively stable.  Recent thyroid  lab with low TSH of 0.02 and hide normal free T4 of 1.6.  She has been taking levothyroxine  in the morning about 30 minutes prior to her coffee.  Discussed that fatigue can be multifactorial, may have been contributed by mild elevated thyroid  levels.  Low TSH lipids and high level of thyroid  hormones.  No other complaints today.  REVIEW OF SYSTEMS:  As per history of present illness.   PAST MEDICAL HISTORY: Past Medical History:  Diagnosis Date   Anxiety    Headache    migraines   HELLP (hemolytic anemia/elev liver enzymes/low platelets in pregnancy)    HELLP (hemolytic anemia/elev liver enzymes/low platelets in pregnancy)    Hypothyroidism    nodule   Preeclampsia     PAST SURGICAL HISTORY: Past Surgical History:  Procedure Laterality Date  BREAST SURGERY     augmentation   CESAREAN SECTION  02/05/14   HELLP syndrome   THYROID  LOBECTOMY Right 03/01/2017   Procedure: RIGHT THYROID  LOBECTOMY;  Surgeon: Eletha Boas, MD;  Location: [facility];  Service: General;   Laterality: Right;   THYROIDECTOMY N/A 03/25/2017   Procedure: COMPLETION THYROIDECTOMY;  Surgeon: Eletha Boas, MD;  Location: [facility];  Service: General;  Laterality: N/A;   TONSILLECTOMY  2005    ALLERGIES: No Known Allergies  FAMILY HISTORY:  Family History  Problem Relation Age of Onset   Heart disease Father    Hypertension Father    Diabetes Father     SOCIAL HISTORY: Social History   Socioeconomic History   Marital status: Married    Spouse name: Not on file   Number of children: Not on file   Years of education: Not on file   Highest education level: Not on file  Occupational History   Not on file  Tobacco Use   Smoking status: Former   Smokeless tobacco: Never   Tobacco comments:    hx of socially  Vaping Use   Vaping status: Never Used  Substance and Sexual Activity   Alcohol  use: Yes    Alcohol /week: 3.0 standard drinks of alcohol     Types: 3 Glasses of wine per week    Comment: social   Drug use: No   Sexual activity: Yes    Birth control/protection: None  Other Topics Concern   Not on file  Social History Narrative   Not on file   Social Drivers of Health   Financial Resource Strain: Not on file  Food Insecurity: Not on file  Transportation Needs: Not on file  Physical Activity: Not on file  Stress: Not on file  Social Connections: Not on file    MEDICATIONS:  Current Outpatient Medications  Medication Sig Dispense Refill   NORTRIPTYLINE HCL PO Take 20 mg by mouth at bedtime.     tirzepatide (ZEPBOUND) 2.5 MG/0.5ML injection vial Inject into the skin.     fluconazole  (DIFLUCAN ) 150 MG tablet Take 1 tablet (150 mg total) by mouth daily. (Patient not taking: Reported on 11/12/2023) 1 tablet 0   levothyroxine  (SYNTHROID ) 125 MCG tablet Take 1 tablet (125 mcg total) by mouth daily. TAKE ONE TABLET BY MOUTH EVERY DAY 90 tablet 3   No current facility-administered medications for this visit.    PHYSICAL EXAM: Vitals:   11/12/23 1307  BP:  110/80  Pulse: 77  Resp: 16  SpO2: 98%  Weight: 146 lb 3.2 oz (66.3 kg)  Height: 5' 5 (1.651 m)    Body mass index is 24.33 kg/m.   General: Well developed, well nourished female in no apparent distress.  HEENT: AT/[place], no external lesions. Hearing intact to the spoken word Eyes: EOMI.  Conjunctiva clear and no icterus. Neck: Trachea midline, no lump in the neck.  Thyroidectomy scar present. Lungs: Clear to auscultation, no wheeze. Respirations not labored Heart: S1S2, Regular in rate and rhythm.  Abdomen: Soft Neurologic: Alert, oriented, normal speech, deep tendon biceps reflexes normal,  no gross focal neurological deficit Extremities: No pedal pitting edema, no tremors of outstretched hands Skin: Warm, color good.  Psychiatric: Does not appear depressed or anxious  PERTINENT HISTORIC LABORATORY AND IMAGING STUDIES:  All pertinent laboratory results were reviewed. Please see HPI also for further details.   TSH  Date Value Ref Range Status  11/07/2023 0.02 (L) mIU/L Final    Comment:  Reference Range .           > or = 20 Years  0.40-4.50 .                Pregnancy Ranges           First trimester    0.26-2.66           Second trimester   0.55-2.73           Third trimester    0.43-2.91   01/23/2023 0.01 (L) 0.35 - 5.50 uIU/mL Final  11/29/2022 <0.01 (L) 0.35 - 5.50 uIU/mL Final     ASSESSMENT / PLAN  1. Papillary thyroid  carcinoma (HCC)   2. Postsurgical hypothyroidism    - Patient has h/o papillary thyroid  carcinoma multifocal 4 cm, no LN +, capsule invasion, follicular variant, s/p right lobectomy in 02/2017 and completion thyroidectomy in 03/2017, s/p RAI ablation I-131 with .  - Thyroid  cancer surveillance with negative and low 0.1 thyroglobulin and thyroglobulin antibody in the past, over all reassuring.  Last thyroglobulin is stable 0.1.  Ultrasound neck was reassuring in October 2024. - She has intermittent history of thyroid  cancer  recurrence, we will keep TSH ~ 0.5 range.  Plan: -Recent TSH 0.02.  No obvious hypothyroid symptoms however she complains of intermittent fatigue.  Will decrease levothyroxine  from 137 to 125 mcg daily. - Check TSH, FT4 in 3 months.  Will check thyroglobulin and thyroglobulin antibody for thyroid  cancer surveillance and next set of lab. - Lab visit in 3 months and follow-up with me in 6 months.   Diagnoses and all orders for this visit:  Papillary thyroid  carcinoma (HCC) -     Thyroglobulin, LC/MS/MS -     Thyroglobulin antibody  Postsurgical hypothyroidism -     levothyroxine  (SYNTHROID ) 125 MCG tablet; Take 1 tablet (125 mcg total) by mouth daily. TAKE ONE TABLET BY MOUTH EVERY DAY -     T4, free -     TSH    DISPOSITION Follow up in clinic in 6 months suggested.  All questions answered and patient verbalized understanding of the plan.  Leah Jermisha Hoffart, MD [facility] Endocrinology [facility] [address] [place], [address] [place], [place] [address] Phone # [phone]  At least part of this note was generated using voice recognition software. Inadvertent word errors may have occurred, which were not recognized during the proofreading process.

## 2023-11-12 NOTE — Patient Instructions (Signed)
 Decrease levothyroxine  to 125 mcg daily.  Lab in 3 months.

## 2023-11-12 NOTE — Telephone Encounter (Signed)
 Patient seen in the clinic today.

## 2023-11-13 ENCOUNTER — Ambulatory Visit: Admitting: Endocrinology

## 2023-12-04 NOTE — Progress Notes (Deleted)
 PCP: Franchot Houston, PA-C   No chief complaint on file.   HPI:      [name] is a 48 y.o. G2P0011 whose LMP was No LMP recorded., presents today for her annual examination.  Her menses are {norm/abn:715}, lasting {number: 22536} days.  Dysmenorrhea {dysmen:716}. She {does:18564} have intermenstrual bleeding. She {does:18564} have vasomotor sx.   Sex activity: {sex active: 315163}. She {does:18564} have vaginal dryness.  Last Pap: 12/03/22 Results were: no abnormalities /neg HPV DNA.  Hx of STDs: {STD hx:14358}  Last mammogram: 03/12/22 at [facility],  Results were: normal--routine follow-up in 12 months There is no FH of breast cancer. There is no FH of ovarian cancer. The patient {does:18564} do self-breast exams.  Colonoscopy: {hx:15363}  Repeat due after 10*** years.   Tobacco use: {tob:20664} Alcohol  use: {Alcohol :11675} No drug use Exercise: {exercise:31265}  She {does:18564} get adequate calcium  and Vitamin D  in her diet.  Normal labs last yr   Patient Active Problem List   Diagnosis Date Noted   Obesity 05/08/2021   Vitamin D  deficiency 06/20/2018   Postsurgical hypothyroidism 04/29/2017   Papillary thyroid  carcinoma (HCC) 03/12/2017    Past Surgical History:  Procedure Laterality Date   BREAST SURGERY     augmentation   CESAREAN SECTION  02/05/14   HELLP syndrome   THYROID  LOBECTOMY Right 03/01/2017   Procedure: RIGHT THYROID  LOBECTOMY;  Surgeon: Eletha Boas, MD;  Location: [facility];  Service: General;  Laterality: Right;   THYROIDECTOMY N/A 03/25/2017   Procedure: COMPLETION THYROIDECTOMY;  Surgeon: Eletha Boas, MD;  Location: [facility];  Service: General;  Laterality: N/A;   TONSILLECTOMY  2005    Family History  Problem Relation Age of Onset   Heart disease Father    Hypertension Father    Diabetes Father     Social History   Socioeconomic History   Marital status: Married    Spouse name: Not on file   Number of children: Not on  file   Years of education: Not on file   Highest education level: Not on file  Occupational History   Not on file  Tobacco Use   Smoking status: Former   Smokeless tobacco: Never   Tobacco comments:    hx of socially  Vaping Use   Vaping status: Never Used  Substance and Sexual Activity   Alcohol  use: Yes    Alcohol /week: 3.0 standard drinks of alcohol     Types: 3 Glasses of wine per week    Comment: social   Drug use: No   Sexual activity: Yes    Birth control/protection: None  Other Topics Concern   Not on file  Social History Narrative   Not on file   Social Drivers of Health   Financial Resource Strain: Not on file  Food Insecurity: Not on file  Transportation Needs: Not on file  Physical Activity: Not on file  Stress: Not on file  Social Connections: Not on file  Intimate Partner Violence: Not on file     Current Outpatient Medications:    fluconazole  (DIFLUCAN ) 150 MG tablet, Take 1 tablet (150 mg total) by mouth daily. (Patient not taking: Reported on 11/12/2023), Disp: 1 tablet, Rfl: 0   levothyroxine  (SYNTHROID ) 125 MCG tablet, Take 1 tablet (125 mcg total) by mouth daily. TAKE ONE TABLET BY MOUTH EVERY DAY, Disp: 90 tablet, Rfl: 3   NORTRIPTYLINE HCL PO, Take 20 mg by mouth at bedtime., Disp: , Rfl:    tirzepatide (ZEPBOUND) 2.5  MG/0.5ML injection vial, Inject into the skin., Disp: , Rfl:      ROS:  Review of Systems BREAST: No symptoms    Objective: There were no vitals taken for this visit.   OBGyn Exam  Results: No results found for this or any previous visit (from the past 24 hours).  Assessment/Plan:  No diagnosis found.   No orders of the defined types were placed in this encounter.           GYN counsel {counseling: 16159}    F/U  No follow-ups on file.  Kaileen Bronkema B. Yurianna Tusing, PA-C 12/04/2023 5:16 PM

## 2023-12-05 ENCOUNTER — Ambulatory Visit: Admitting: Obstetrics and Gynecology

## 2023-12-05 DIAGNOSIS — Z1231 Encounter for screening mammogram for malignant neoplasm of breast: Secondary | ICD-10-CM

## 2023-12-05 DIAGNOSIS — Z01419 Encounter for gynecological examination (general) (routine) without abnormal findings: Secondary | ICD-10-CM

## 2023-12-05 DIAGNOSIS — Z1211 Encounter for screening for malignant neoplasm of colon: Secondary | ICD-10-CM

## 2024-02-10 ENCOUNTER — Other Ambulatory Visit

## 2024-02-14 LAB — T4, FREE: Free T4: 1.5 ng/dL (ref 0.8–1.8)

## 2024-02-14 LAB — TSH: TSH: 0.28 m[IU]/L — ABNORMAL LOW

## 2024-02-14 LAB — THYROGLOBULIN ANTIBODY: Thyroglobulin Ab: 1 [IU]/mL (ref <=1)

## 2024-02-14 LAB — THYROGLOBULIN, LC/MS/MS: THYROGLOBULIN, LC/MS/MS: <0.4 ng/mL

## 2024-02-17 ENCOUNTER — Ambulatory Visit: Payer: Self-pay | Admitting: Endocrinology

## 2024-04-27 DIAGNOSIS — J358 Other chronic diseases of tonsils and adenoids: Secondary | ICD-10-CM | POA: Insufficient documentation

## 2024-04-27 NOTE — Progress Notes (Unsigned)
 "  ANNUAL EXAM Patient name: Leah Thompson MRN [id number]  Date of birth: Mar 27, 1976 Chief Complaint:   No chief complaint on file.  History of Present Illness:   Leah Thompson is a 49 y.o. G78P0011 {race:25618} female being seen today for a routine annual exam.  Current complaints: ***  No LMP recorded.       The pregnancy intention screening data noted above was reviewed. Potential methods of contraception were discussed. The patient elected to proceed with No data recorded.      Component Value Date/Time   DIAGPAP  12/03/2022 0902    - Negative for intraepithelial lesion or malignancy (NILM)   DIAGPAP  05/08/2021 0926    - Negative for intraepithelial lesion or malignancy (NILM)   DIAGPAP  04/14/2019 1534    - Negative for intraepithelial lesion or malignancy (NILM)   HPVHIGH Negative 12/03/2022 0902   HPVHIGH Negative 04/14/2019 1534   ADEQPAP  12/03/2022 0902    Satisfactory for evaluation; transformation zone component PRESENT.   ADEQPAP  05/08/2021 0926    Satisfactory for evaluation; transformation zone component PRESENT.   ADEQPAP  04/14/2019 1534    Satisfactory for evaluation; transformation zone component PRESENT.      Last pap ***. Results were: {Pap findings:25134}. H/O abnormal pap: {yes/yes***/no:23866} Last mammogram: ***. Results were: {normal, abnormal, n/a:23837}. Family h/o breast cancer: {yes***/no:23838} Last colonoscopy: ***. Results were: {normal, abnormal, n/a:23837}. Family h/o colorectal cancer: {yes***/no:23838}     12/03/2022   11:46 AM 05/08/2021    9:34 AM  Depression screen PHQ 2/9  Decreased Interest 0 1  Down, Depressed, Hopeless 1 1  PHQ - 2 Score 1 2  Tired, decreased energy  1  Change in appetite  2  Feeling bad or failure about yourself   0  Trouble concentrating  0  Moving slowly or fidgety/restless  0  Suicidal thoughts  0  Difficult doing work/chores  Not difficult at all        05/08/2021    9:35 AM   GAD 7 : Generalized Anxiety Score  Nervous, Anxious, on Edge 0  Control/stop worrying 0  Worry too much - different things 1  Trouble relaxing 0  Restless 0  Easily annoyed or irritable 1  Afraid - awful might happen 0  Total GAD 7 Score 2  Anxiety Difficulty Not difficult at all      Past Medical History:  Diagnosis Date   Anxiety    Headache    migraines   HELLP (hemolytic anemia/elev liver enzymes/low platelets in pregnancy)    HELLP (hemolytic anemia/elev liver enzymes/low platelets in pregnancy)    Hypothyroidism    nodule   Preeclampsia     Family History  Problem Relation Age of Onset   Heart disease Father    Hypertension Father    Diabetes Father    Review of Systems:   Pertinent items are noted in HPI Denies any headaches, blurred vision, fatigue, shortness of breath, chest pain, abdominal pain, abnormal vaginal discharge/itching/odor/irritation, problems with periods, bowel movements, urination, or intercourse unless otherwise stated above. Pertinent History Reviewed:  Reviewed past medical,surgical, social and family history.  Reviewed problem list, medications and allergies. Physical Assessment:  There were no vitals filed for this visit.There is no height or weight on file to calculate BMI.       Physical Exam   No results found for this or any previous visit (from the past 24 hours).  Assessment & Plan:  There are  no diagnoses linked to this encounter.  Mammogram: {Mammo f/u:25212::@ 49yo}, or sooner if problems Colonoscopy: {TCS f/u:25213::@ 49yo}, or sooner if problems  No orders of the defined types were placed in this encounter.   Meds: No orders of the defined types were placed in this encounter.   Follow-up: No follow-ups on file.  [name] Getting, CMA 04/27/2024 10:35 AM  "

## 2024-04-28 ENCOUNTER — Ambulatory Visit (INDEPENDENT_AMBULATORY_CARE_PROVIDER_SITE_OTHER): Admitting: Certified Nurse Midwife

## 2024-04-28 ENCOUNTER — Other Ambulatory Visit (HOSPITAL_COMMUNITY)
Admission: RE | Admit: 2024-04-28 | Discharge: 2024-04-28 | Disposition: A | Source: Ambulatory Visit | Attending: Certified Nurse Midwife | Admitting: Certified Nurse Midwife

## 2024-04-28 ENCOUNTER — Encounter: Payer: Self-pay | Admitting: Certified Nurse Midwife

## 2024-04-28 VITALS — BP 117/80 | HR 80 | Ht 65.0 in | Wt 153.5 lb

## 2024-04-28 DIAGNOSIS — N898 Other specified noninflammatory disorders of vagina: Secondary | ICD-10-CM | POA: Diagnosis not present

## 2024-04-28 DIAGNOSIS — Z1211 Encounter for screening for malignant neoplasm of colon: Secondary | ICD-10-CM

## 2024-04-28 DIAGNOSIS — Z01419 Encounter for gynecological examination (general) (routine) without abnormal findings: Secondary | ICD-10-CM

## 2024-04-28 DIAGNOSIS — Z01411 Encounter for gynecological examination (general) (routine) with abnormal findings: Secondary | ICD-10-CM

## 2024-04-28 DIAGNOSIS — Z124 Encounter for screening for malignant neoplasm of cervix: Secondary | ICD-10-CM | POA: Diagnosis present

## 2024-04-28 DIAGNOSIS — Z1231 Encounter for screening mammogram for malignant neoplasm of breast: Secondary | ICD-10-CM

## 2024-04-28 DIAGNOSIS — Z114 Encounter for screening for human immunodeficiency virus [HIV]: Secondary | ICD-10-CM

## 2024-04-28 DIAGNOSIS — E890 Postprocedural hypothyroidism: Secondary | ICD-10-CM

## 2024-04-28 DIAGNOSIS — Z131 Encounter for screening for diabetes mellitus: Secondary | ICD-10-CM

## 2024-04-28 DIAGNOSIS — Z1151 Encounter for screening for human papillomavirus (HPV): Secondary | ICD-10-CM | POA: Diagnosis present

## 2024-04-28 DIAGNOSIS — Z113 Encounter for screening for infections with a predominantly sexual mode of transmission: Secondary | ICD-10-CM | POA: Insufficient documentation

## 2024-04-28 DIAGNOSIS — Z1329 Encounter for screening for other suspected endocrine disorder: Secondary | ICD-10-CM

## 2024-04-28 NOTE — Patient Instructions (Addendum)
 Preventive Care 47-47 Years Old, Female Preventive care refers to lifestyle choices and visits with your health care provider that can promote health and wellness. Preventive care visits are also called wellness exams. What can I expect for my preventive care visit? Counseling Your health care provider may ask you questions about your: Medical history, including: Past medical problems. Family medical history. Pregnancy history. Current health, including: Menstrual cycle. Method of birth control. Emotional well-being. Home life and relationship well-being. Sexual activity and sexual health. Lifestyle, including: Alcohol , nicotine or tobacco, and drug use. Access to firearms. Diet, exercise, and sleep habits. Work and work astronomer. Sunscreen use. Safety issues such as seatbelt and bike helmet use. Physical exam Your health care provider will check your: Height and weight. These may be used to calculate your BMI (body mass index). BMI is a measurement that tells if you are at a healthy weight. Waist circumference. This measures the distance around your waistline. This measurement also tells if you are at a healthy weight and may help predict your risk of certain diseases, such as type 2 diabetes and high blood pressure. Heart rate and blood pressure. Body temperature. Skin for abnormal spots. What immunizations do I need?  Vaccines are usually given at various ages, according to a schedule. Your health care provider will recommend vaccines for you based on your age, medical history, and lifestyle or other factors, such as travel or where you work. What tests do I need? Screening Your health care provider may recommend screening tests for certain conditions. This may include: Lipid and cholesterol levels. Diabetes screening. This is done by checking your blood sugar (glucose) after you have not eaten for a while (fasting). Pelvic exam and Pap test. Hepatitis B test. Hepatitis C  test. HIV (human immunodeficiency virus) test. STI (sexually transmitted infection) testing, if you are at risk. Lung cancer screening. Colorectal cancer screening. Mammogram. Talk with your health care provider about when you should start having regular mammograms. This may depend on whether you have a family history of breast cancer. BRCA-related cancer screening. This may be done if you have a family history of breast, ovarian, tubal, or peritoneal cancers. Bone density scan. This is done to screen for osteoporosis. Talk with your health care provider about your test results, treatment options, and if necessary, the need for more tests. Follow these instructions at home: Eating and drinking  Eat a diet that includes fresh fruits and vegetables, whole grains, lean protein, and low-fat dairy products. Take vitamin and mineral supplements as recommended by your health care provider. Do not drink alcohol  if: Your health care provider tells you not to drink. You are pregnant, may be pregnant, or are planning to become pregnant. If you drink alcohol : Limit how much you have to 0-1 drink a day. Know how much alcohol  is in your drink. In the [place] one drink equals one 12 oz bottle of beer (355 mL), one 5 oz glass of wine (148 mL), or one 1 oz glass of hard liquor (44 mL). Lifestyle Brush your teeth every morning and night with fluoride toothpaste. Floss one time each day. Exercise for at least 30 minutes 5 or more days each week. Do not use any products that contain nicotine or tobacco. These products include cigarettes, chewing tobacco, and vaping devices, such as e-cigarettes. If you need help quitting, ask your health care provider. Do not use drugs. If you are sexually active, practice safe sex. Use a condom or other form of protection to  prevent STIs. If you do not wish to become pregnant, use a form of birth control. If you plan to become pregnant, see your health care provider for a  prepregnancy visit. Take aspirin only as told by your health care provider. Make sure that you understand how much to take and what form to take. Work with your health care provider to find out whether it is safe and beneficial for you to take aspirin daily. Find healthy ways to manage stress, such as: Meditation, yoga, or listening to music. Journaling. Talking to a trusted person. Spending time with friends and family. Minimize exposure to UV radiation to reduce your risk of skin cancer. Safety Always wear your seat belt while driving or riding in a vehicle. Do not drive: If you have been drinking alcohol . Do not ride with someone who has been drinking. When you are tired or distracted. While texting. If you have been using any mind-altering substances or drugs. Wear a helmet and other protective equipment during sports activities. If you have firearms in your house, make sure you follow all gun safety procedures. Seek help if you have been physically or sexually abused. What's next? Visit your health care provider once a year for an annual wellness visit. Ask your health care provider how often you should have your eyes and teeth checked. Stay up to date on all vaccines. This information is not intended to replace advice given to you by your health care provider. Make sure you discuss any questions you have with your health care provider. Document Revised: 09/28/2020 Document Reviewed: 09/28/2020 Elsevier Patient Education  2024 [employer] Health Maintenance, Female Adopting a healthy lifestyle and getting preventive care are important in promoting health and wellness. Ask your health care provider about: The right schedule for you to have regular tests and exams. Things you can do on your own to prevent diseases and keep yourself healthy. What should I know about diet, weight, and exercise? Eat a healthy diet  Eat a diet that includes plenty of vegetables, fruits, low-fat  dairy products, and lean protein. Do not eat a lot of foods that are high in solid fats, added sugars, or sodium. Maintain a healthy weight Body mass index (BMI) is used to identify weight problems. It estimates body fat based on height and weight. Your health care provider can help determine your BMI and help you achieve or maintain a healthy weight. Get regular exercise Get regular exercise. This is one of the most important things you can do for your health. Most adults should: Exercise for at least 150 minutes each week. The exercise should increase your heart rate and make you sweat (moderate-intensity exercise). Do strengthening exercises at least twice a week. This is in addition to the moderate-intensity exercise. Spend less time sitting. Even light physical activity can be beneficial. Watch cholesterol and blood lipids Have your blood tested for lipids and cholesterol at 47 years of age, then have this test every 5 years. Have your cholesterol levels checked more often if: Your lipid or cholesterol levels are high. You are older than 47 years of age. You are at high risk for heart disease. What should I know about cancer screening? Depending on your health history and family history, you may need to have cancer screening at various ages. This may include screening for: Breast cancer. Cervical cancer. Colorectal cancer. Skin cancer. Lung cancer. What should I know about heart disease, diabetes, and high blood pressure? Blood pressure and heart disease High  blood pressure causes heart disease and increases the risk of stroke. This is more likely to develop in people who have high blood pressure readings or are overweight. Have your blood pressure checked: Every 3-5 years if you are 42-34 years of age. Every year if you are 107 years old or older. Diabetes Have regular diabetes screenings. This checks your fasting blood sugar level. Have the screening done: Once every three years  after age 54 if you are at a normal weight and have a low risk for diabetes. More often and at a younger age if you are overweight or have a high risk for diabetes. What should I know about preventing infection? Hepatitis B If you have a higher risk for hepatitis B, you should be screened for this virus. Talk with your health care provider to find out if you are at risk for hepatitis B infection. Hepatitis C Testing is recommended for: Everyone born from [birth year] through 1963. Anyone with known risk factors for hepatitis C. Sexually transmitted infections (STIs) Get screened for STIs, including gonorrhea and chlamydia, if: You are sexually active and are younger than 47 years of age. You are older than 47 years of age and your health care provider tells you that you are at risk for this type of infection. Your sexual activity has changed since you were last screened, and you are at increased risk for chlamydia or gonorrhea. Ask your health care provider if you are at risk. Ask your health care provider about whether you are at high risk for HIV. Your health care provider may recommend a prescription medicine to help prevent HIV infection. If you choose to take medicine to prevent HIV, you should first get tested for HIV. You should then be tested every 3 months for as long as you are taking the medicine. Pregnancy If you are about to stop having your period (premenopausal) and you may become pregnant, seek counseling before you get pregnant. Take 400 to 800 micrograms (mcg) of folic acid every day if you become pregnant. Ask for birth control (contraception) if you want to prevent pregnancy. Osteoporosis and menopause Osteoporosis is a disease in which the bones lose minerals and strength with aging. This can result in bone fractures. If you are 96 years old or older, or if you are at risk for osteoporosis and fractures, ask your health care provider if you should: Be screened for bone loss. Take a  calcium  or vitamin D  supplement to lower your risk of fractures. Be given hormone replacement therapy (HRT) to treat symptoms of menopause. Follow these instructions at home: Alcohol  use Do not drink alcohol  if: Your health care provider tells you not to drink. You are pregnant, may be pregnant, or are planning to become pregnant. If you drink alcohol : Limit how much you have to: 0-1 drink a day. Know how much alcohol  is in your drink. In the [place] one drink equals one 12 oz bottle of beer (355 mL), one 5 oz glass of wine (148 mL), or one 1 oz glass of hard liquor (44 mL). Lifestyle Do not use any products that contain nicotine or tobacco. These products include cigarettes, chewing tobacco, and vaping devices, such as e-cigarettes. If you need help quitting, ask your health care provider. Do not use street drugs. Do not share needles. Ask your health care provider for help if you need support or information about quitting drugs. General instructions Schedule regular health, dental, and eye exams. Stay current with your vaccines. Tell  your health care provider if: You often feel depressed. You have ever been abused or do not feel safe at home. Summary Adopting a healthy lifestyle and getting preventive care are important in promoting health and wellness. Follow your health care provider's instructions about healthy diet, exercising, and getting tested or screened for diseases. Follow your health care provider's instructions on monitoring your cholesterol and blood pressure. This information is not intended to replace advice given to you by your health care provider. Make sure you discuss any questions you have with your health care provider. Document Revised: 08/22/2020 Document Reviewed: 08/22/2020 Elsevier Patient Education  2024 [employer] Colonoscopy, Adult A colonoscopy is a procedure to look at the entire large intestine. This procedure is done using a long, thin, flexible  tube that has a camera on the end. You may have a colonoscopy: As a part of normal colorectal screening. If you have certain symptoms, such as: A low number of red blood cells in your blood (anemia). Diarrhea that does not go away. Pain in your abdomen. Blood in your stool. A colonoscopy can help screen for and diagnose medical problems, including: An abnormal growth of cells or tissue (tumor). Abnormal growths within the lining of your intestine (polyps). Inflammation. Areas of bleeding. Tell your health care provider about: Any allergies you have. All medicines you are taking, including vitamins, herbs, eye drops, creams, and over-the-counter medicines. Any problems you or family members have had with anesthetic medicines. Any bleeding problems you have. Any surgeries you have had. Any medical conditions you have. Any problems you have had with having bowel movements. Whether you are pregnant or may be pregnant. What are the risks? Generally, this is a safe procedure. However, problems may occur, including: Bleeding. Damage to your intestine. Allergic reactions to medicines given during the procedure. Infection. This is rare. What happens before the procedure? Eating and drinking restrictions Follow instructions from your health care provider about eating or drinking restrictions, which may include: A few days before the procedure: Follow a low-fiber diet. Avoid nuts, seeds, dried fruit, raw fruits, and vegetables. 1-3 days before the procedure: Eat only gelatin dessert or ice pops. Drink only clear liquids, such as water, clear juice, clear broth or bouillon, black coffee or tea, or clear soft drinks or sports drinks. Avoid liquids that contain red or purple dye. The day of the procedure: Do not eat solid foods. You may continue to drink clear liquids until up to 2 hours before the procedure. Do not eat or drink anything starting 2 hours before the procedure, or within the  time period that your health care provider recommends. Bowel prep If you were prescribed a bowel prep to take by mouth (orally) to clean out your colon: Take it as told by your health care provider. Starting the day before your procedure, you will need to drink a large amount of liquid medicine. The liquid will cause you to have many bowel movements of loose stool until your stool becomes almost clear or light green. If your skin or the opening between the buttocks (anus) gets irritated from diarrhea, you may relieve the irritation using: Wipes with medicine in them, such as adult wet wipes with aloe and vitamin E. A product to soothe skin, such as petroleum jelly. If you vomit while drinking the bowel prep: Take a break for up to 60 minutes. Begin the bowel prep again. Call your health care provider if you keep vomiting or you cannot take the bowel prep  without vomiting. To clean out your colon, you may also be given: Laxative medicines. These help you have a bowel movement. Instructions for enema use. An enema is liquid medicine injected into your rectum. Medicines Ask your health care provider about: Changing or stopping your regular medicines or supplements. This is especially important if you are taking iron supplements, diabetes medicines, or blood thinners. Taking medicines such as aspirin and ibuprofen. These medicines can thin your blood. Do not take these medicines unless your health care provider tells you to take them. Taking over-the-counter medicines, vitamins, herbs, and supplements. General instructions Ask your health care provider what steps will be taken to help prevent infection. These may include washing skin with a germ-killing soap. If you will be going home right after the procedure, plan to have a responsible adult: Take you home from the hospital or clinic. You will not be allowed to drive. Care for you for the time you are told. What happens during the  procedure?  An IV will be inserted into one of your veins. You will be given a medicine to make you fall asleep (general anesthetic). You will lie on your side with your knees bent. A lubricant will be put on the tube. Then the tube will be: Inserted into your anus. Gently eased through all parts of your large intestine. Air will be sent into your colon to keep it open. This may cause some pressure or cramping. Images will be taken with the camera and will appear on a screen. A small tissue sample may be removed to be looked at under a microscope (biopsy). The tissue may be sent to a lab for testing if any signs of problems are found. If small polyps are found, they may be removed and checked for cancer cells. When the procedure is finished, the tube will be removed. The procedure may vary among health care providers and hospitals. What happens after the procedure? Your blood pressure, heart rate, breathing rate, and blood oxygen level will be monitored until you leave the hospital or clinic. You may have a small amount of blood in your stool. You may pass gas and have mild cramping or bloating in your abdomen. This is caused by the air that was used to open your colon during the exam. If you were given a sedative during the procedure, it can affect you for several hours. Do not drive or operate machinery until your health care provider says that it is safe. It is up to you to get the results of your procedure. Ask your health care provider, or the department that is doing the procedure, when your results will be ready. Summary A colonoscopy is a procedure to look at the entire large intestine. Follow instructions from your health care provider about eating and drinking before the procedure. If you were prescribed an oral bowel prep to clean out your colon, take it as told by your health care provider. During the colonoscopy, a flexible tube with a camera on its end is inserted into the anus  and then passed into all parts of the large intestine. This information is not intended to replace advice given to you by your health care provider. Make sure you discuss any questions you have with your health care provider. Document Revised: 05/15/2022 Document Reviewed: 11/23/2020 Elsevier Patient Education  2024 [employer].

## 2024-04-29 ENCOUNTER — Encounter: Payer: Self-pay | Admitting: Certified Nurse Midwife

## 2024-04-29 ENCOUNTER — Encounter: Payer: Self-pay | Admitting: Endocrinology

## 2024-04-29 LAB — CERVICOVAGINAL ANCILLARY ONLY
Bacterial Vaginitis (gardnerella): NEGATIVE
Candida Glabrata: NEGATIVE
Candida Vaginitis: NEGATIVE
Chlamydia: NEGATIVE
Comment: NEGATIVE
Comment: NEGATIVE
Comment: NEGATIVE
Comment: NEGATIVE
Comment: NEGATIVE
Comment: NORMAL
Neisseria Gonorrhea: NEGATIVE
Trichomonas: NEGATIVE

## 2024-04-29 LAB — CBC
Hematocrit: 36.4 % (ref 34.0–46.6)
Hemoglobin: 11.6 g/dL (ref 11.1–15.9)
MCH: 30.6 pg (ref 26.6–33.0)
MCHC: 31.9 g/dL (ref 31.5–35.7)
MCV: 96 fL (ref 79–97)
Platelets: 286 x10E3/uL (ref 150–450)
RBC: 3.79 x10E6/uL (ref 3.77–5.28)
RDW: 11.6 % — ABNORMAL LOW (ref 11.7–15.4)
WBC: 11.4 x10E3/uL — ABNORMAL HIGH (ref 3.4–10.8)

## 2024-04-29 LAB — COMPREHENSIVE METABOLIC PANEL WITH GFR
ALT: 19 IU/L (ref 0–32)
AST: 18 IU/L (ref 0–40)
Albumin: 4.5 g/dL (ref 3.9–4.9)
Alkaline Phosphatase: 65 IU/L (ref 41–116)
BUN/Creatinine Ratio: 20 (ref 9–23)
BUN: 12 mg/dL (ref 6–24)
Bilirubin Total: <0.2 mg/dL (ref 0.0–1.2)
CO2: 22 mmol/L (ref 20–29)
Calcium: 9.4 mg/dL (ref 8.7–10.2)
Chloride: 105 mmol/L (ref 96–106)
Creatinine, Ser: 0.6 mg/dL (ref 0.57–1.00)
Globulin, Total: 2 g/dL (ref 1.5–4.5)
Glucose: 81 mg/dL (ref 70–99)
Potassium: 4.4 mmol/L (ref 3.5–5.2)
Sodium: 141 mmol/L (ref 134–144)
Total Protein: 6.5 g/dL (ref 6.0–8.5)
eGFR: 112 mL/min/1.73

## 2024-04-29 LAB — TSH+FREE T4
Free T4: 1.52 ng/dL (ref 0.82–1.77)
TSH: 0.067 u[IU]/mL — ABNORMAL LOW (ref 0.450–4.500)

## 2024-04-29 LAB — LIPID PANEL
Chol/HDL Ratio: 2.5 ratio (ref 0.0–4.4)
Cholesterol, Total: 201 mg/dL — ABNORMAL HIGH (ref 100–199)
HDL: 79 mg/dL
LDL Chol Calc (NIH): 110 mg/dL — ABNORMAL HIGH (ref 0–99)
Triglycerides: 69 mg/dL (ref 0–149)
VLDL Cholesterol Cal: 12 mg/dL (ref 5–40)

## 2024-04-29 LAB — HEMOGLOBIN A1C
Est. average glucose Bld gHb Est-mCnc: 91 mg/dL
Hgb A1c MFr Bld: 4.8 % (ref 4.8–5.6)

## 2024-04-29 LAB — SYPHILIS: RPR W/REFLEX TO RPR TITER AND TREPONEMAL ANTIBODIES, TRADITIONAL SCREENING AND DIAGNOSIS ALGORITHM: RPR Ser Ql: NONREACTIVE

## 2024-04-29 LAB — ESTRADIOL: Estradiol: 108 pg/mL

## 2024-04-29 LAB — HEPATITIS B SURFACE ANTIGEN: Hepatitis B Surface Ag: NEGATIVE

## 2024-04-29 LAB — VITAMIN D 25 HYDROXY (VIT D DEFICIENCY, FRACTURES): Vit D, 25-Hydroxy: 25.5 ng/mL — ABNORMAL LOW (ref 30.0–100.0)

## 2024-04-29 LAB — PROGESTERONE: Progesterone: 0.2 ng/mL

## 2024-04-29 LAB — HIV ANTIBODY (ROUTINE TESTING W REFLEX): HIV Screen 4th Generation wRfx: NONREACTIVE

## 2024-04-29 LAB — FSH/LH
FSH: 7.4 m[IU]/mL
LH: 6.5 m[IU]/mL

## 2024-04-29 LAB — HEPATITIS C ANTIBODY: Hep C Virus Ab: NONREACTIVE

## 2024-04-30 ENCOUNTER — Encounter: Payer: Self-pay | Admitting: Certified Nurse Midwife

## 2024-04-30 LAB — CYTOLOGY - PAP
Comment: NEGATIVE
Diagnosis: NEGATIVE
High risk HPV: NEGATIVE

## 2024-04-30 NOTE — Telephone Encounter (Signed)
"   Latest Reference Range & Units 04/28/24 09:40  TSH 0.450 - 4.500 uIU/mL 0.067 (L)  T4,Free(Direct) 0.82 - 1.77 ng/dL 8.47  (L): Data is abnormally low  Based on test results, levothyroxine  dose can be decreased however you had TSH of 0.28 in October on the current dose.  Mildly low TSH is appropriate in the context of having history of thyroid  cancer.  Unless there has been significant weight loss or you have symptoms of overactive thyroid  for example palpitation, mildly low TSH this time can only be transient.  Therefore I would not like to change the dose of levothyroxine  at this time.  If you continue to have low TSH we may have to consider decreasing dose in the future.  Khiara Shuping, MD [facility] [address] [place], [address] [place], [place] [address] Phone # [phone]'  "

## 2024-05-01 ENCOUNTER — Ambulatory Visit: Payer: Self-pay | Admitting: Certified Nurse Midwife

## 2024-05-01 MED ORDER — NORGESTREL 0.075 MG PO TABS: 1.0000 | ORAL_TABLET | Freq: Every day | ORAL | 3 refills | Status: AC

## 2024-05-01 MED ORDER — PROPRANOLOL HCL 10 MG PO TABS: 10.0000 mg | ORAL_TABLET | Freq: Three times a day (TID) | ORAL | 1 refills | Status: AC | PRN

## 2024-05-01 MED ORDER — VITAMIN D (ERGOCALCIFEROL) 1.25 MG (50000 UNIT) PO CAPS: 50000.0000 [IU] | ORAL_CAPSULE | ORAL | 0 refills | Status: AC

## 2024-05-01 NOTE — Addendum Note (Signed)
 Addended by: JAYNE RAISIN on: 05/01/2024 03:50 PM   Modules accepted: Orders

## 2024-05-12 ENCOUNTER — Telehealth: Payer: Self-pay

## 2024-05-12 ENCOUNTER — Other Ambulatory Visit: Payer: Self-pay

## 2024-05-12 DIAGNOSIS — Z1211 Encounter for screening for malignant neoplasm of colon: Secondary | ICD-10-CM

## 2024-05-12 MED ORDER — NA SULFATE-K SULFATE-MG SULF 17.5-3.13-1.6 GM/177ML PO SOLN: 1.0000 | Freq: Once | ORAL | 0 refills | Status: AC

## 2024-05-12 NOTE — Telephone Encounter (Signed)
 Gastroenterology Pre-Procedure Review  Request Date: 07/10/24 Requesting Physician: Dr. Theophilus  PATIENT REVIEW QUESTIONS: The patient responded to the following health history questions as indicated:    1. Are you having any GI issues? no 2. Do you have a personal history of Polyps? no 3. Do you have a family history of Colon Cancer or Polyps? no 4. Diabetes Mellitus? no 5. Joint replacements in the past 12 months?no 6. Major health problems in the past 3 months?no 7. Any artificial heart valves, MVP, or defibrillator?no  8. Patient takes Zepbound has been advised to stop 7 days prior to colonoscopy.   MEDICATIONS & ALLERGIES:    Patient reports the following regarding taking any anticoagulation/antiplatelet therapy:   Plavix, Coumadin, Eliquis, Xarelto, Lovenox, Pradaxa, Brilinta, or Effient? no Aspirin? no  Patient confirms/reports the following medications:  Current Outpatient Medications  Medication Sig Dispense Refill   tirzepatide (ZEPBOUND) 2.5 MG/0.5ML injection vial Inject into the skin.     fluconazole  (DIFLUCAN ) 150 MG tablet Take 1 tablet (150 mg total) by mouth daily. (Patient taking differently: Take 150 mg by mouth as needed.) 1 tablet 0   levothyroxine  (SYNTHROID ) 125 MCG tablet Take 1 tablet (125 mcg total) by mouth daily. TAKE ONE TABLET BY MOUTH EVERY DAY 90 tablet 3   Norgestrel  0.075 MG TABS Take 1 tablet by mouth daily. 84 each 3   nortriptyline (PAMELOR) 10 MG capsule Take by mouth.     NORTRIPTYLINE HCL PO Take 20 mg by mouth at bedtime.     ondansetron  (ZOFRAN -ODT) 4 MG disintegrating tablet Take 4 mg by mouth every 8 (eight) hours as needed.     propranolol  (INDERAL ) 10 MG tablet Take 1 tablet (10 mg total) by mouth 3 (three) times daily as needed (anxiety). 60 tablet 1   rizatriptan (MAXALT-MLT) 10 MG disintegrating tablet Take by mouth.     Vitamin D , Ergocalciferol , (DRISDOL ) 1.25 MG (50000 UNIT) CAPS capsule Take 1 capsule (50,000 Units total) by  mouth every 7 (seven) days for 6 doses. 6 capsule 0   No current facility-administered medications for this visit.    Patient confirms/reports the following allergies:  Allergies[1]  No orders of the defined types were placed in this encounter.   AUTHORIZATION INFORMATION Primary Insurance: 1D#: Group #:  Secondary Insurance: 1D#: Group #:  SCHEDULE INFORMATION: Date:  Time: Location:     [1] No Known Allergies

## 2024-05-14 ENCOUNTER — Ambulatory Visit: Admitting: Endocrinology

## 2024-05-26 ENCOUNTER — Ambulatory Visit: Admitting: Endocrinology

## 2024-07-10 ENCOUNTER — Ambulatory Visit: Admit: 2024-07-10
